# Patient Record
Sex: Female | Born: 1965
Health system: Southern US, Community
[De-identification: ages and names within clinical notes are randomized; demographics above are authoritative.]

## PROBLEM LIST (undated history)

## (undated) DIAGNOSIS — T8859XA Other complications of anesthesia, initial encounter: Secondary | ICD-10-CM

## (undated) DIAGNOSIS — R112 Nausea with vomiting, unspecified: Secondary | ICD-10-CM

## (undated) DIAGNOSIS — I10 Essential (primary) hypertension: Secondary | ICD-10-CM

## (undated) DIAGNOSIS — K219 Gastro-esophageal reflux disease without esophagitis: Secondary | ICD-10-CM

## (undated) DIAGNOSIS — J189 Pneumonia, unspecified organism: Secondary | ICD-10-CM

## (undated) DIAGNOSIS — M1990 Unspecified osteoarthritis, unspecified site: Secondary | ICD-10-CM

## (undated) DIAGNOSIS — F32A Depression, unspecified: Secondary | ICD-10-CM

## (undated) DIAGNOSIS — D649 Anemia, unspecified: Secondary | ICD-10-CM

## (undated) DIAGNOSIS — F419 Anxiety disorder, unspecified: Secondary | ICD-10-CM

## (undated) DIAGNOSIS — F319 Bipolar disorder, unspecified: Secondary | ICD-10-CM

## (undated) DIAGNOSIS — Z9889 Other specified postprocedural states: Secondary | ICD-10-CM

## (undated) DIAGNOSIS — F209 Schizophrenia, unspecified: Secondary | ICD-10-CM

## (undated) HISTORY — PX: JOINT REPLACEMENT: SHX530

## (undated) HISTORY — PX: CHOLECYSTECTOMY: SHX55

## (undated) HISTORY — PX: NOSE SURGERY: SHX723

## (undated) HISTORY — PX: TONSILLECTOMY: SUR1361

---

## 2019-03-26 ENCOUNTER — Other Ambulatory Visit: Payer: Self-pay | Admitting: Podiatry

## 2019-03-26 ENCOUNTER — Ambulatory Visit: Payer: Self-pay | Admitting: Podiatry

## 2019-03-26 DIAGNOSIS — M79672 Pain in left foot: Secondary | ICD-10-CM

## 2019-11-07 ENCOUNTER — Other Ambulatory Visit: Payer: Self-pay | Admitting: Orthopedic Surgery

## 2019-11-13 DIAGNOSIS — R0602 Shortness of breath: Secondary | ICD-10-CM | POA: Diagnosis not present

## 2019-11-29 NOTE — Progress Notes (Addendum)
PCP - [facility] Cardiologist -   PPM/ICD -  Device Orders -  Rep Notified -   Chest x-ray -  EKG [facility]  Stress Test -  ECHO -  Cardiac Cath -   Sleep Study -  CPAP -   Fasting Blood Sugar -  Checks Blood Sugar _____ times a day  Blood Thinner Instructions: Aspirin Instructions:  ERAS Protcol - PRE-SURGERY Ensure or G2-   COVID TEST-  No COVID vaccinations   Anesthesia review:   Patient denies shortness of breath, fever, cough and chest pain at PAT appointment   All instructions explained to the patient, with a verbal understanding of the material. Patient agrees to go over the instructions while at home for a better understanding. Patient also instructed to self quarantine after being tested for COVID-19. The opportunity to ask questions was provided.

## 2019-11-29 NOTE — Patient Instructions (Addendum)
DUE TO COVID-19 ONLY ONE VISITOR IS ALLOWED TO COME WITH YOU AND STAY IN THE WAITING ROOM ONLY DURING PRE OP AND PROCEDURE DAY OF SURGERY. THE 1 VISITOR MAY VISIT WITH YOU AFTER SURGERY IN YOUR PRIVATE ROOM DURING VISITING HOURS ONLY!  YOU NEED TO HAVE A COVID 19 TEST ON 12-05-19 @12 :00 PM, THIS TEST MUST BE DONE BEFORE SURGERY, COME  [address], [place] , .  [facility]) ONCE YOUR COVID TEST IS COMPLETED, PLEASE BEGIN THE QUARANTINE INSTRUCTIONS AS OUTLINED IN YOUR HANDOUT.                Terri Oneill  11/29/2019   Your procedure is scheduled on: 12-09-19   Report to [facility] Main  Entrance    Report to Admitting at  5:30 AM     Call this number if you have problems the morning of surgery [phone]    Remember: NO SOLID FOOD AFTER MIDNIGHT THE NIGHT PRIOR TO SURGERY. NOTHING BY MOUTH EXCEPT CLEAR LIQUIDS UNTIL 4:15 AM . PLEASE FINISH ENSURE DRINK PER SURGEON ORDER  WHICH NEEDS TO BE COMPLETED AT 4:15 AM .   CLEAR LIQUID DIET   Foods Allowed                                                                     Foods Excluded  Coffee and tea, regular and decaf                             liquids that you cannot  Plain Jell-O any favor except red or purple                                           see through such as: Fruit ices (not with fruit pulp)                                     milk, soups, orange juice  Iced Popsicles                                    All solid food Carbonated beverages, regular and diet                                    Cranberry, grape and apple juices Sports drinks like Gatorade Lightly seasoned clear broth or consume(fat free) Sugar, honey syrup  _____________________________________________________________________     Take these medicines the morning of surgery with A SIP OF WATER:  Aripiprazole (Abilify), Atenolol (Tenormin), Clonidine (Catapres), Hydralazine (Apresoline), Omeprazole (Prilosec),  Oxybutynin (Ditropan), and Oxycodone, prn   BRUSH YOUR TEETH MORNING OF SURGERY AND RINSE YOUR MOUTH OUT, NO CHEWING GUM CANDY OR MINTS.  You may not have any metal on your body including hair pins and              piercings     Do not wear jewelry, make-up, lotions, powders or perfumes, deodorant              Do not wear nail polish on your fingernails.  Do not shave  48 hours prior to surgery.                Do not bring valuables to the hospital. [place].  Contacts, dentures or bridgework may not be worn into surgery.  Leave suitcase in the car. After surgery it may be brought to your room.      Special Instructions: N/A              Please read over the following fact sheets you were given: _____________________________________________________________________             [facility] - Preparing for Surgery Before surgery, you can play an important role.  Because skin is not sterile, your skin needs to be as free of germs as possible.  You can reduce the number of germs on your skin by washing with CHG (chlorahexidine gluconate) soap before surgery.  CHG is an antiseptic cleaner which kills germs and bonds with the skin to continue killing germs even after washing. Please DO NOT use if you have an allergy to CHG or antibacterial soaps.  If your skin becomes reddened/irritated stop using the CHG and inform your nurse when you arrive at Short Stay. Do not shave (including legs and underarms) for at least 48 hours prior to the first CHG shower.  You may shave your face/neck. Please follow these instructions carefully:  1.  Shower with CHG Soap the night before surgery and the  morning of Surgery.  2.  If you choose to wash your hair, wash your hair first as usual with your  normal  shampoo.  3.  After you shampoo, rinse your hair and body thoroughly to remove the  shampoo.                           4.   Use CHG as you would any other liquid soap.  You can apply chg directly  to the skin and wash                       Gently with a scrungie or clean washcloth.  5.  Apply the CHG Soap to your body ONLY FROM THE NECK DOWN.   Do not use on face/ open                           Wound or open sores. Avoid contact with eyes, ears mouth and genitals (private parts).                       Wash face,  Genitals (private parts) with your normal soap.             6.  Wash thoroughly, paying special attention to the area where your surgery  will be performed.  7.  Thoroughly rinse your body with warm water from the neck down.  8.  DO NOT shower/wash with  your normal soap after using and rinsing off  the CHG Soap.                9.  Pat yourself dry with a clean towel.            10.  Wear clean pajamas.            11.  Place clean sheets on your bed the night of your first shower and do not  sleep with pets. Day of Surgery : Do not apply any lotions/deodorants the morning of surgery.  Please wear clean clothes to the hospital/surgery center.  FAILURE TO FOLLOW THESE INSTRUCTIONS MAY RESULT IN THE CANCELLATION OF YOUR SURGERY PATIENT SIGNATURE_________________________________  NURSE SIGNATURE__________________________________  ________________________________________________________________________   Rogelia Mire  An incentive spirometer is a tool that can help keep your lungs clear and active. This tool measures how well you are filling your lungs with each breath. Taking long deep breaths may help reverse or decrease the chance of developing breathing (pulmonary) problems (especially infection) following:  A long period of time when you are unable to move or be active. BEFORE THE PROCEDURE   If the spirometer includes an indicator to show your best effort, your nurse or respiratory therapist will set it to a desired goal.  If possible, sit up straight or lean slightly forward. Try not to  slouch.  Hold the incentive spirometer in an upright position. INSTRUCTIONS FOR USE  1. Sit on the edge of your bed if possible, or sit up as far as you can in bed or on a chair. 2. Hold the incentive spirometer in an upright position. 3. Breathe out normally. 4. Place the mouthpiece in your mouth and seal your lips tightly around it. 5. Breathe in slowly and as deeply as possible, raising the piston or the ball toward the top of the column. 6. Hold your breath for 3-5 seconds or for as long as possible. Allow the piston or ball to fall to the bottom of the column. 7. Remove the mouthpiece from your mouth and breathe out normally. 8. Rest for a few seconds and repeat Steps 1 through 7 at least 10 times every 1-2 hours when you are awake. Take your time and take a few normal breaths between deep breaths. 9. The spirometer may include an indicator to show your best effort. Use the indicator as a goal to work toward during each repetition. 10. After each set of 10 deep breaths, practice coughing to be sure your lungs are clear. If you have an incision (the cut made at the time of surgery), support your incision when coughing by placing a pillow or rolled up towels firmly against it. Once you are able to get out of bed, walk around indoors and cough well. You may stop using the incentive spirometer when instructed by your caregiver.  RISKS AND COMPLICATIONS  Take your time so you do not get dizzy or light-headed.  If you are in pain, you may need to take or ask for pain medication before doing incentive spirometry. It is harder to take a deep breath if you are having pain. AFTER USE  Rest and breathe slowly and easily.  It can be helpful to keep track of a log of your progress. Your caregiver can provide you with a simple table to help with this. If you are using the spirometer at home, follow these instructions: SEEK MEDICAL CARE IF:   You are having difficultly using the spirometer.  You  have trouble using the spirometer as often as instructed.  Your pain medication is not giving enough relief while using the spirometer.  You develop fever of 100.5 F (38.1 C) or higher. SEEK IMMEDIATE MEDICAL CARE IF:   You cough up bloody sputum that had not been present before.  You develop fever of 102 F (38.9 C) or greater.  You develop worsening pain at or near the incision site. MAKE SURE YOU:   Understand these instructions.  Will watch your condition.  Will get help right away if you are not doing well or get worse. Document Released: 10/24/2006 Document Revised: 09/05/2011 Document Reviewed: 12/25/2006 Exit[employer] Patient Information 2014 [employer], [place].   ________________________________________________________________________  WHAT IS A BLOOD TRANSFUSION? Blood Transfusion Information  A transfusion is the replacement of blood or some of its parts. Blood is made up of multiple cells which provide different functions.  Red blood cells carry oxygen and are used for blood loss replacement.  White blood cells fight against infection.  Platelets control bleeding.  Plasma helps clot blood.  Other blood products are available for specialized needs, such as hemophilia or other clotting disorders. BEFORE THE TRANSFUSION  Who gives blood for transfusions?   Healthy volunteers who are fully evaluated to make sure their blood is safe. This is blood bank blood. Transfusion therapy is the safest it has ever been in the practice of medicine. Before blood is taken from a donor, a complete history is taken to make sure that person has no history of diseases nor engages in risky social behavior (examples are intravenous drug use or sexual activity with multiple partners). The donor's travel history is screened to minimize risk of transmitting infections, such as malaria. The donated blood is tested for signs of infectious diseases, such as HIV and hepatitis. The blood is then  tested to be sure it is compatible with you in order to minimize the chance of a transfusion reaction. If you or a relative donates blood, this is often done in anticipation of surgery and is not appropriate for emergency situations. It takes many days to process the donated blood. RISKS AND COMPLICATIONS Although transfusion therapy is very safe and saves many lives, the main dangers of transfusion include:   Getting an infectious disease.  Developing a transfusion reaction. This is an allergic reaction to something in the blood you were given. Every precaution is taken to prevent this. The decision to have a blood transfusion has been considered carefully by your caregiver before blood is given. Blood is not given unless the benefits outweigh the risks. AFTER THE TRANSFUSION  Right after receiving a blood transfusion, you will usually feel much better and more energetic. This is especially true if your red blood cells have gotten low (anemic). The transfusion raises the level of the red blood cells which carry oxygen, and this usually causes an energy increase.  The nurse administering the transfusion will monitor you carefully for complications. HOME CARE INSTRUCTIONS  No special instructions are needed after a transfusion. You may find your energy is better. Speak with your caregiver about any limitations on activity for underlying diseases you may have. SEEK MEDICAL CARE IF:   Your condition is not improving after your transfusion.  You develop redness or irritation at the intravenous (IV) site. SEEK IMMEDIATE MEDICAL CARE IF:  Any of the following symptoms occur over the next 12 hours:  Shaking chills.  You have a temperature by mouth above 102 F (38.9 C), not  controlled by medicine.  Chest, back, or muscle pain.  People around you feel you are not acting correctly or are confused.  Shortness of breath or difficulty breathing.  Dizziness and fainting.  You get a rash or  develop hives.  You have a decrease in urine output.  Your urine turns a dark color or changes to pink, red, or brown. Any of the following symptoms occur over the next 10 days:  You have a temperature by mouth above 102 F (38.9 C), not controlled by medicine.  Shortness of breath.  Weakness after normal activity.  The white part of the eye turns yellow (jaundice).  You have a decrease in the amount of urine or are urinating less often.  Your urine turns a dark color or changes to pink, red, or brown. Document Released: 06/10/2000 Document Revised: 09/05/2011 Document Reviewed: 01/28/2008 [facility] Patient Information 2014 [place], [place].  _______________________________________________________________________

## 2019-12-02 ENCOUNTER — Other Ambulatory Visit: Payer: Self-pay

## 2019-12-02 ENCOUNTER — Encounter (HOSPITAL_COMMUNITY): Payer: Self-pay

## 2019-12-02 ENCOUNTER — Encounter (HOSPITAL_COMMUNITY)
Admission: RE | Admit: 2019-12-02 | Discharge: 2019-12-02 | Disposition: A | Discharge status: Home / Self Care | Payer: Medicare Other | Source: Ambulatory Visit | Attending: Orthopedic Surgery | Admitting: Orthopedic Surgery

## 2019-12-02 ENCOUNTER — Ambulatory Visit (HOSPITAL_COMMUNITY)
Admission: RE | Admit: 2019-12-02 | Discharge: 2019-12-02 | Disposition: A | Discharge status: Home / Self Care | Payer: Medicare Other | Source: Ambulatory Visit | Attending: Orthopedic Surgery | Admitting: Orthopedic Surgery

## 2019-12-02 DIAGNOSIS — R9431 Abnormal electrocardiogram [ECG] [EKG]: Secondary | ICD-10-CM | POA: Insufficient documentation

## 2019-12-02 DIAGNOSIS — Z01818 Encounter for other preprocedural examination: Secondary | ICD-10-CM | POA: Insufficient documentation

## 2019-12-02 HISTORY — DX: Unspecified osteoarthritis, unspecified site: M19.90

## 2019-12-02 HISTORY — DX: Anxiety disorder, unspecified: F41.9

## 2019-12-02 HISTORY — DX: Pneumonia, unspecified organism: J18.9

## 2019-12-02 HISTORY — DX: Gastro-esophageal reflux disease without esophagitis: K21.9

## 2019-12-02 HISTORY — DX: Depression, unspecified: F32.A

## 2019-12-02 HISTORY — DX: Other complications of anesthesia, initial encounter: T88.59XA

## 2019-12-02 HISTORY — DX: Bipolar disorder, unspecified: F31.9

## 2019-12-02 HISTORY — DX: Other specified postprocedural states: R11.2

## 2019-12-02 HISTORY — DX: Anemia, unspecified: D64.9

## 2019-12-02 HISTORY — DX: Nausea with vomiting, unspecified: Z98.890

## 2019-12-02 HISTORY — DX: Schizophrenia, unspecified: F20.9

## 2019-12-02 HISTORY — DX: Essential (primary) hypertension: I10

## 2019-12-02 LAB — URINALYSIS, ROUTINE W REFLEX MICROSCOPIC
Bilirubin Urine: NEGATIVE
Glucose, UA: NEGATIVE mg/dL
Hgb urine dipstick: NEGATIVE
Ketones, ur: NEGATIVE mg/dL
Leukocytes,Ua: NEGATIVE
Nitrite: NEGATIVE
Protein, ur: NEGATIVE mg/dL
Specific Gravity, Urine: 1.012 (ref 1.005–1.030)
pH: 5 (ref 5.0–8.0)

## 2019-12-02 LAB — CBC WITH DIFFERENTIAL/PLATELET
Abs Immature Granulocytes: 0.05 10*3/uL (ref 0.00–0.07)
Basophils Absolute: 0 10*3/uL (ref 0.0–0.1)
Basophils Relative: 0 %
Eosinophils Absolute: 0.2 10*3/uL (ref 0.0–0.5)
Eosinophils Relative: 2 %
HCT: 39.7 % (ref 36.0–46.0)
Hemoglobin: 13.3 g/dL (ref 12.0–15.0)
Immature Granulocytes: 1 %
Lymphocytes Relative: 22 %
Lymphs Abs: 2.2 10*3/uL (ref 0.7–4.0)
MCH: 30.8 pg (ref 26.0–34.0)
MCHC: 33.5 g/dL (ref 30.0–36.0)
MCV: 91.9 fL (ref 80.0–100.0)
Monocytes Absolute: 0.7 10*3/uL (ref 0.1–1.0)
Monocytes Relative: 7 %
Neutro Abs: 6.7 10*3/uL (ref 1.7–7.7)
Neutrophils Relative %: 68 %
Platelets: 220 10*3/uL (ref 150–400)
RBC: 4.32 MIL/uL (ref 3.87–5.11)
RDW: 12.8 % (ref 11.5–15.5)
WBC: 9.9 10*3/uL (ref 4.0–10.5)
nRBC: 0 % (ref 0.0–0.2)

## 2019-12-02 LAB — BASIC METABOLIC PANEL
Anion gap: 10 (ref 5–15)
BUN: 14 mg/dL (ref 6–20)
CO2: 25 mmol/L (ref 22–32)
Calcium: 9.1 mg/dL (ref 8.9–10.3)
Chloride: 105 mmol/L (ref 98–111)
Creatinine, Ser: 0.88 mg/dL (ref 0.44–1.00)
GFR calc Af Amer: >60 mL/min (ref >60)
GFR calc non Af Amer: >60 mL/min (ref >60)
Glucose, Bld: 98 mg/dL (ref 70–99)
Potassium: 4.1 mmol/L (ref 3.5–5.1)
Sodium: 140 mmol/L (ref 135–145)

## 2019-12-02 LAB — ABO/RH: ABO/RH(D): A NEG

## 2019-12-02 LAB — PROTIME-INR
INR: 1.1 (ref 0.8–1.2)
Prothrombin Time: 13.3 seconds (ref 11.4–15.2)

## 2019-12-02 LAB — APTT: aPTT: 26 seconds (ref 24–36)

## 2019-12-02 LAB — SURGICAL PCR SCREEN
MRSA, PCR: NEGATIVE
Staphylococcus aureus: POSITIVE — AB

## 2019-12-05 ENCOUNTER — Other Ambulatory Visit (HOSPITAL_COMMUNITY)
Admission: RE | Admit: 2019-12-05 | Discharge: 2019-12-05 | Disposition: A | Discharge status: Home / Self Care | Payer: Medicare Other | Source: Ambulatory Visit | Attending: Orthopedic Surgery | Admitting: Orthopedic Surgery

## 2019-12-05 DIAGNOSIS — Z20822 Contact with and (suspected) exposure to covid-19: Secondary | ICD-10-CM | POA: Diagnosis not present

## 2019-12-05 DIAGNOSIS — Z01812 Encounter for preprocedural laboratory examination: Secondary | ICD-10-CM | POA: Diagnosis present

## 2019-12-05 LAB — SARS CORONAVIRUS 2 (TAT 6-24 HRS): SARS Coronavirus 2: NEGATIVE

## 2019-12-06 DIAGNOSIS — M1611 Unilateral primary osteoarthritis, right hip: Secondary | ICD-10-CM | POA: Diagnosis present

## 2019-12-06 NOTE — H&P (Signed)
TOTAL HIP ADMISSION H&P  Patient is admitted for right total hip arthroplasty.  Subjective:  Chief Complaint: right hip pain  HPI: Terri Oneill, 54 y.o. female, has a history of pain and functional disability in the right hip(s) due to arthritis and patient has failed non-surgical conservative treatments for greater than 12 weeks to include NSAID's and/or analgesics and activity modification.  Onset of symptoms was gradual starting several years ago with gradually worsening course since that time.The patient noted no past surgery on the right hip(s).  Patient currently rates pain in the right hip at 10 out of 10 with activity. Patient has night pain, worsening of pain with activity and weight bearing, trendelenberg gait, pain that interfers with activities of daily living and pain with passive range of motion. Patient has evidence of joint subluxation and joint space narrowing by imaging studies. This condition presents safety issues increasing the risk of falls.    There is no current active infection.  Patient Active Problem List   Diagnosis Date Noted  . Osteoarthritis of right hip 12/06/2019   Past Medical History:  Diagnosis Date  . Anemia   . Anxiety   . Arthritis   . Bipolar disorder (HCC)   . Complication of anesthesia   . Depression   . GERD (gastroesophageal reflux disease)   . Hypertension   . Pneumonia   . PONV (postoperative nausea and vomiting)   . Schizophrenia [facility])     Past Surgical History:  Procedure Laterality Date  . CHOLECYSTECTOMY    . JOINT REPLACEMENT Bilateral    2018 R Knee, 2019 -L knee  . NOSE SURGERY    . TONSILLECTOMY      No current facility-administered medications for this encounter.   Current Outpatient Medications  Medication Sig Dispense Refill Last Dose  . ARIPiprazole (ABILIFY) 2 MG tablet Take 2 mg by mouth daily.     Marland Kitchen atenolol (TENORMIN) 100 MG tablet Take 100 mg by mouth daily.     . cloNIDine (CATAPRES) 0.1 MG tablet Take 0.1 mg  by mouth in the morning, at noon, and at bedtime.     Marland Kitchen estradiol (ESTRACE) 0.5 MG tablet Take 0.5 mg by mouth every evening.     . hydrALAZINE (APRESOLINE) 100 MG tablet Take 100 mg by mouth 3 (three) times daily.     Marland Kitchen lisinopril (ZESTRIL) 5 MG tablet Take 5 mg by mouth daily.     Marland Kitchen lovastatin (MEVACOR) 40 MG tablet Take 40 mg by mouth daily.     . Multiple Vitamin (MULTIVITAMIN WITH MINERALS) TABS tablet Take 1 tablet by mouth daily. Centrum Silver for Women 50+     . naproxen (NAPROSYN) 500 MG tablet Take 500 mg by mouth in the morning and at bedtime.     . naproxen sodium (ALEVE) 220 MG tablet Take 880 mg by mouth 3 (three) times daily as needed (pain.).     Marland Kitchen nortriptyline (PAMELOR) 50 MG capsule Take 50 mg by mouth at bedtime.     Marland Kitchen omeprazole (PRILOSEC) 20 MG capsule Take 20 mg by mouth daily before breakfast.     . oxybutynin (DITROPAN) 5 MG tablet Take 5 mg by mouth daily.     Marland Kitchen oxyCODONE-acetaminophen (PERCOCET) 10-325 MG tablet Take 1 tablet by mouth 4 (four) times daily as needed for pain.     . progesterone (PROMETRIUM) 100 MG capsule Take 100 mg by mouth at bedtime.     . traZODone (DESYREL) 100 MG tablet Take 300  mg by mouth at bedtime.      No Known Allergies  Social History   Tobacco Use  . Smoking status: Never Smoker  . Smokeless tobacco: Never Used  Substance Use Topics  . Alcohol use: Not Currently    Comment: Pt reports that she is in 'recovery'    No family history on file.   Review of Systems  Constitutional: Positive for diaphoresis and fatigue.  HENT: Negative.   Eyes: Negative.   Respiratory: Negative.   Cardiovascular:       HTN  Gastrointestinal: Positive for constipation and nausea.  Genitourinary: Positive for frequency.       Poor bladder control  Musculoskeletal: Positive for arthralgias and myalgias.  Skin: Negative.   Allergic/Immunologic: Negative.   Neurological: Negative.   Hematological: Negative.   Psychiatric/Behavioral: The patient  is nervous/anxious.        Depression    Objective:  Physical Exam  Constitutional: She is oriented to person, place, and time.  HENT:  Head: Normocephalic and atraumatic.  Nose: Nose normal.  Eyes: Pupils are equal, round, and reactive to light.  Cardiovascular: Normal pulses.  Respiratory: Effort normal.  Musculoskeletal:     Cervical back: Normal range of motion and neck supple.     Comments: Patient walks with a profound right-sided limp highly irritable right hip to internal less so external rotation.  Both total knees have well-healed surgical scars come to full extension and flex to 115 collateral ligaments are stable neurovascular intact no swelling or erythema no effusions.  Nontender over the trochanteric bursa.  She does have right-sided hip pain with flexion past 100.  Neurological: She is alert and oriented to person, place, and time.  Skin: Skin is warm and dry.  Psychiatric: Her behavior is normal. Mood, judgment and thought content normal.    Vital signs in last 24 hours:    Labs:   Estimated body mass index is 33.83 kg/m as calculated from the following:   Height as of 12/02/19: 5\' 3"  (1.6 m).   Weight as of 12/02/19: 86.6 kg.   Imaging Review Plain radiographs demonstrate  AP pelvis and crosstable lateral so bone-on-bone arthritic changes to the right hip with pistol-grip deformity peripheral osteophytes and lateral subluxation of the femoral head.     Assessment/Plan:  End stage arthritis, right hip(s)  The patient history, physical examination, clinical judgement of the provider and imaging studies are consistent with end stage degenerative joint disease of the right hip(s) and total hip arthroplasty is deemed medically necessary. The treatment options including medical management, injection therapy, arthroscopy and arthroplasty were discussed at length. The risks and benefits of total hip arthroplasty were presented and reviewed. The risks due to aseptic  loosening, infection, stiffness, dislocation/subluxation,  thromboembolic complications and other imponderables were discussed.  The patient acknowledged the explanation, agreed to proceed with the plan and consent was signed. Patient is being admitted for inpatient treatment for surgery, pain control, PT, OT, prophylactic antibiotics, VTE prophylaxis, progressive ambulation and ADL's and discharge planning.The patient is planning to be discharged home with home health services    Patient's anticipated Oneill is less than 2 midnights, meeting these requirements: - Younger than 56 - Lives within 1 hour of care - Has a competent adult at home to recover with post-op recover - NO history of  - Chronic pain requiring opiods  - Diabetes  - Coronary Artery Disease  - Heart failure  - Heart attack  - Stroke  - DVT/VTE  -  Cardiac arrhythmia  - Respiratory Failure/COPD  - Renal failure  - Anemia  - Advanced Liver disease

## 2019-12-08 MED ORDER — TRANEXAMIC ACID 1000 MG/10ML IV SOLN
2000.0000 mg | INTRAVENOUS | Status: DC
  Filled 2019-12-08: qty 20

## 2019-12-08 MED ORDER — BUPIVACAINE LIPOSOME 1.3 % IJ SUSP
10.0000 mL | INTRAMUSCULAR | Status: DC
  Filled 2019-12-08: qty 10

## 2019-12-09 ENCOUNTER — Ambulatory Visit (HOSPITAL_COMMUNITY): Payer: Medicare Other | Admitting: Certified Registered"

## 2019-12-09 ENCOUNTER — Other Ambulatory Visit: Payer: Self-pay

## 2019-12-09 ENCOUNTER — Observation Stay (HOSPITAL_COMMUNITY)
Admission: RE | Admit: 2019-12-09 | Discharge: 2019-12-11 | Disposition: A | Discharge status: Home / Self Care | Payer: Medicare Other | Source: Ambulatory Visit | Attending: Orthopedic Surgery | Admitting: Orthopedic Surgery

## 2019-12-09 ENCOUNTER — Encounter (HOSPITAL_COMMUNITY): Payer: Self-pay | Admitting: Orthopedic Surgery

## 2019-12-09 ENCOUNTER — Ambulatory Visit (HOSPITAL_COMMUNITY): Payer: Medicare Other

## 2019-12-09 ENCOUNTER — Encounter (HOSPITAL_COMMUNITY)
Admission: RE | Disposition: A | Discharge status: Home / Self Care | Payer: Self-pay | Source: Ambulatory Visit | Attending: Orthopedic Surgery

## 2019-12-09 DIAGNOSIS — M1611 Unilateral primary osteoarthritis, right hip: Secondary | ICD-10-CM | POA: Diagnosis not present

## 2019-12-09 DIAGNOSIS — Z419 Encounter for procedure for purposes other than remedying health state, unspecified: Secondary | ICD-10-CM

## 2019-12-09 DIAGNOSIS — Z96641 Presence of right artificial hip joint: Secondary | ICD-10-CM

## 2019-12-09 DIAGNOSIS — F319 Bipolar disorder, unspecified: Secondary | ICD-10-CM | POA: Diagnosis not present

## 2019-12-09 DIAGNOSIS — Z791 Long term (current) use of non-steroidal anti-inflammatories (NSAID): Secondary | ICD-10-CM | POA: Insufficient documentation

## 2019-12-09 DIAGNOSIS — I10 Essential (primary) hypertension: Secondary | ICD-10-CM | POA: Insufficient documentation

## 2019-12-09 DIAGNOSIS — D62 Acute posthemorrhagic anemia: Secondary | ICD-10-CM | POA: Insufficient documentation

## 2019-12-09 DIAGNOSIS — K219 Gastro-esophageal reflux disease without esophagitis: Secondary | ICD-10-CM | POA: Insufficient documentation

## 2019-12-09 DIAGNOSIS — Z79890 Hormone replacement therapy (postmenopausal): Secondary | ICD-10-CM | POA: Insufficient documentation

## 2019-12-09 DIAGNOSIS — Z96653 Presence of artificial knee joint, bilateral: Secondary | ICD-10-CM | POA: Diagnosis not present

## 2019-12-09 DIAGNOSIS — F209 Schizophrenia, unspecified: Secondary | ICD-10-CM | POA: Diagnosis not present

## 2019-12-09 DIAGNOSIS — F419 Anxiety disorder, unspecified: Secondary | ICD-10-CM | POA: Diagnosis not present

## 2019-12-09 DIAGNOSIS — Z79899 Other long term (current) drug therapy: Secondary | ICD-10-CM | POA: Insufficient documentation

## 2019-12-09 HISTORY — PX: TOTAL HIP ARTHROPLASTY: SHX124

## 2019-12-09 LAB — TYPE AND SCREEN
ABO/RH(D): A NEG
Antibody Screen: NEGATIVE

## 2019-12-09 LAB — HEMOGLOBIN AND HEMATOCRIT, BLOOD
HCT: 32.4 % — ABNORMAL LOW (ref 36.0–46.0)
Hemoglobin: 10.8 g/dL — ABNORMAL LOW (ref 12.0–15.0)

## 2019-12-09 SURGERY — ARTHROPLASTY, HIP, TOTAL, ANTERIOR APPROACH
Anesthesia: Spinal | Site: Hip | Laterality: Right

## 2019-12-09 MED ORDER — FENTANYL CITRATE (PF) 100 MCG/2ML IJ SOLN: 25.0000 ug | INTRAMUSCULAR | Status: DC | PRN

## 2019-12-09 MED ORDER — TRAZODONE HCL 100 MG PO TABS
300.0000 mg | ORAL_TABLET | Freq: Every day | ORAL | Status: DC
  Administered 2019-12-09 – 2019-12-10 (×2): 300 mg via ORAL
  Filled 2019-12-09 (×2): qty 3

## 2019-12-09 MED ORDER — ARIPIPRAZOLE 2 MG PO TABS
2.0000 mg | ORAL_TABLET | Freq: Every day | ORAL | Status: DC
  Administered 2019-12-09 – 2019-12-11 (×3): 2 mg via ORAL
  Filled 2019-12-09 (×3): qty 1

## 2019-12-09 MED ORDER — MENTHOL 3 MG MT LOZG: 1.0000 | LOZENGE | OROMUCOSAL | Status: DC | PRN

## 2019-12-09 MED ORDER — FLEET ENEMA 7-19 GM/118ML RE ENEM: 1.0000 | ENEMA | Freq: Once | RECTAL | Status: DC | PRN

## 2019-12-09 MED ORDER — POLYETHYLENE GLYCOL 3350 17 G PO PACK: 17.0000 g | PACK | Freq: Every day | ORAL | Status: DC | PRN

## 2019-12-09 MED ORDER — MIDAZOLAM HCL 2 MG/2ML IJ SOLN
INTRAMUSCULAR | Status: DC | PRN
  Administered 2019-12-09: 2 mg via INTRAVENOUS

## 2019-12-09 MED ORDER — METOCLOPRAMIDE HCL 5 MG/ML IJ SOLN: 5.0000 mg | Freq: Three times a day (TID) | INTRAMUSCULAR | Status: DC | PRN

## 2019-12-09 MED ORDER — PROGESTERONE MICRONIZED 100 MG PO CAPS
100.0000 mg | ORAL_CAPSULE | Freq: Every day | ORAL | Status: DC
  Administered 2019-12-09 – 2019-12-10 (×2): 100 mg via ORAL
  Filled 2019-12-09 (×3): qty 1

## 2019-12-09 MED ORDER — GABAPENTIN 100 MG PO CAPS
100.0000 mg | ORAL_CAPSULE | Freq: Three times a day (TID) | ORAL | Status: DC
  Administered 2019-12-09 – 2019-12-11 (×7): 100 mg via ORAL
  Filled 2019-12-09 (×7): qty 1

## 2019-12-09 MED ORDER — KCL IN DEXTROSE-NACL 20-5-0.45 MEQ/L-%-% IV SOLN
INTRAVENOUS | Status: DC
  Filled 2019-12-09 (×4): qty 1000

## 2019-12-09 MED ORDER — POVIDONE-IODINE 10 % EX SWAB
2.0000 "application " | Freq: Once | CUTANEOUS | Status: AC
  Administered 2019-12-09: 2 via TOPICAL

## 2019-12-09 MED ORDER — EPHEDRINE 5 MG/ML INJ
INTRAVENOUS | Status: AC
  Filled 2019-12-09: qty 10

## 2019-12-09 MED ORDER — LACTATED RINGERS IV SOLN: INTRAVENOUS | Status: DC

## 2019-12-09 MED ORDER — ONDANSETRON HCL 4 MG/2ML IJ SOLN
INTRAMUSCULAR | Status: AC
  Filled 2019-12-09: qty 2

## 2019-12-09 MED ORDER — PHENYLEPHRINE 40 MCG/ML (10ML) SYRINGE FOR IV PUSH (FOR BLOOD PRESSURE SUPPORT)
PREFILLED_SYRINGE | INTRAVENOUS | Status: DC | PRN
  Administered 2019-12-09: 80 ug via INTRAVENOUS
  Administered 2019-12-09 (×2): 40 ug via INTRAVENOUS
  Administered 2019-12-09: 80 ug via INTRAVENOUS

## 2019-12-09 MED ORDER — STERILE WATER FOR IRRIGATION IR SOLN
Status: DC | PRN
  Administered 2019-12-09: 2000 mL

## 2019-12-09 MED ORDER — SCOPOLAMINE 1 MG/3DAYS TD PT72
MEDICATED_PATCH | TRANSDERMAL | Status: DC | PRN
  Administered 2019-12-09: 1 via TRANSDERMAL

## 2019-12-09 MED ORDER — SUGAMMADEX SODIUM 200 MG/2ML IV SOLN
INTRAVENOUS | Status: DC | PRN
  Administered 2019-12-09: 180 mg via INTRAVENOUS

## 2019-12-09 MED ORDER — BISACODYL 5 MG PO TBEC: 5.0000 mg | DELAYED_RELEASE_TABLET | Freq: Every day | ORAL | Status: DC | PRN

## 2019-12-09 MED ORDER — HYDROMORPHONE HCL 1 MG/ML IJ SOLN
0.2500 mg | INTRAMUSCULAR | Status: DC | PRN
  Administered 2019-12-09 (×2): 0.5 mg via INTRAVENOUS

## 2019-12-09 MED ORDER — LIDOCAINE 2% (20 MG/ML) 5 ML SYRINGE
INTRAMUSCULAR | Status: AC
  Filled 2019-12-09: qty 5

## 2019-12-09 MED ORDER — PHENYLEPHRINE 40 MCG/ML (10ML) SYRINGE FOR IV PUSH (FOR BLOOD PRESSURE SUPPORT)
PREFILLED_SYRINGE | INTRAVENOUS | Status: AC
  Filled 2019-12-09: qty 10

## 2019-12-09 MED ORDER — ORAL CARE MOUTH RINSE: 15.0000 mL | Freq: Once | OROMUCOSAL | Status: AC

## 2019-12-09 MED ORDER — HYDROMORPHONE HCL 1 MG/ML IJ SOLN
INTRAMUSCULAR | Status: AC
  Administered 2019-12-09: 0.5 mg via INTRAVENOUS
  Filled 2019-12-09: qty 1

## 2019-12-09 MED ORDER — FENTANYL CITRATE (PF) 100 MCG/2ML IJ SOLN
INTRAMUSCULAR | Status: AC
  Filled 2019-12-09: qty 2

## 2019-12-09 MED ORDER — PRAVASTATIN SODIUM 20 MG PO TABS
40.0000 mg | ORAL_TABLET | Freq: Every day | ORAL | Status: DC
  Administered 2019-12-09 – 2019-12-10 (×2): 40 mg via ORAL
  Filled 2019-12-09 (×2): qty 2

## 2019-12-09 MED ORDER — ASPIRIN EC 81 MG PO TBEC: 81.0000 mg | DELAYED_RELEASE_TABLET | Freq: Two times a day (BID) | ORAL | 0 refills | Status: AC

## 2019-12-09 MED ORDER — TRANEXAMIC ACID-NACL 1000-0.7 MG/100ML-% IV SOLN
INTRAVENOUS | Status: AC
  Filled 2019-12-09: qty 100

## 2019-12-09 MED ORDER — BUPIVACAINE LIPOSOME 1.3 % IJ SUSP
INTRAMUSCULAR | Status: DC | PRN
  Administered 2019-12-09: 10 mL

## 2019-12-09 MED ORDER — TIZANIDINE HCL 2 MG PO TABS
2.0000 mg | ORAL_TABLET | Freq: Four times a day (QID) | ORAL | 0 refills | Status: AC | PRN
Start: 2019-12-09 — End: ?

## 2019-12-09 MED ORDER — 0.9 % SODIUM CHLORIDE (POUR BTL) OPTIME
TOPICAL | Status: DC | PRN
  Administered 2019-12-09: 1000 mL

## 2019-12-09 MED ORDER — KETOROLAC TROMETHAMINE 30 MG/ML IJ SOLN: 30.0000 mg | Freq: Once | INTRAMUSCULAR | Status: DC | PRN

## 2019-12-09 MED ORDER — ALBUMIN HUMAN 5 % IV SOLN: INTRAVENOUS | Status: DC | PRN

## 2019-12-09 MED ORDER — NORTRIPTYLINE HCL 25 MG PO CAPS
50.0000 mg | ORAL_CAPSULE | Freq: Every day | ORAL | Status: DC
  Administered 2019-12-09 – 2019-12-10 (×2): 50 mg via ORAL
  Filled 2019-12-09 (×2): qty 2

## 2019-12-09 MED ORDER — DEXAMETHASONE SODIUM PHOSPHATE 10 MG/ML IJ SOLN
INTRAMUSCULAR | Status: DC | PRN
  Administered 2019-12-09: 8 mg via INTRAVENOUS

## 2019-12-09 MED ORDER — ROCURONIUM BROMIDE 10 MG/ML (PF) SYRINGE
PREFILLED_SYRINGE | INTRAVENOUS | Status: AC
  Filled 2019-12-09: qty 10

## 2019-12-09 MED ORDER — LIDOCAINE 2% (20 MG/ML) 5 ML SYRINGE
INTRAMUSCULAR | Status: DC | PRN
  Administered 2019-12-09: 40 mg via INTRAVENOUS

## 2019-12-09 MED ORDER — BUPIVACAINE HCL (PF) 0.25 % IJ SOLN
INTRAMUSCULAR | Status: DC | PRN
  Administered 2019-12-09: 30 mL

## 2019-12-09 MED ORDER — ONDANSETRON HCL 4 MG/2ML IJ SOLN: 4.0000 mg | Freq: Four times a day (QID) | INTRAMUSCULAR | Status: DC | PRN

## 2019-12-09 MED ORDER — ROCURONIUM BROMIDE 10 MG/ML (PF) SYRINGE
PREFILLED_SYRINGE | INTRAVENOUS | Status: DC | PRN
  Administered 2019-12-09: 60 mg via INTRAVENOUS
  Administered 2019-12-09: 20 mg via INTRAVENOUS

## 2019-12-09 MED ORDER — ONDANSETRON HCL 4 MG PO TABS: 4.0000 mg | ORAL_TABLET | Freq: Four times a day (QID) | ORAL | Status: DC | PRN

## 2019-12-09 MED ORDER — DEXAMETHASONE SODIUM PHOSPHATE 10 MG/ML IJ SOLN
10.0000 mg | Freq: Once | INTRAMUSCULAR | Status: AC
  Administered 2019-12-10: 10 mg via INTRAVENOUS
  Filled 2019-12-09: qty 1

## 2019-12-09 MED ORDER — ALUM & MAG HYDROXIDE-SIMETH 200-200-20 MG/5ML PO SUSP: 30.0000 mL | ORAL | Status: DC | PRN

## 2019-12-09 MED ORDER — OXYBUTYNIN CHLORIDE 5 MG PO TABS
5.0000 mg | ORAL_TABLET | Freq: Every day | ORAL | Status: DC
  Administered 2019-12-09 – 2019-12-11 (×3): 5 mg via ORAL
  Filled 2019-12-09 (×3): qty 1

## 2019-12-09 MED ORDER — CEFAZOLIN SODIUM-DEXTROSE 2-4 GM/100ML-% IV SOLN
2.0000 g | INTRAVENOUS | Status: AC
  Administered 2019-12-09: 2 g via INTRAVENOUS

## 2019-12-09 MED ORDER — PHENYLEPHRINE HCL (PRESSORS) 10 MG/ML IV SOLN
INTRAVENOUS | Status: AC
  Filled 2019-12-09: qty 1

## 2019-12-09 MED ORDER — SCOPOLAMINE 1 MG/3DAYS TD PT72
MEDICATED_PATCH | TRANSDERMAL | Status: AC
  Filled 2019-12-09: qty 1

## 2019-12-09 MED ORDER — METOCLOPRAMIDE HCL 5 MG PO TABS: 5.0000 mg | ORAL_TABLET | Freq: Three times a day (TID) | ORAL | Status: DC | PRN

## 2019-12-09 MED ORDER — CLONIDINE HCL 0.1 MG PO TABS
0.1000 mg | ORAL_TABLET | Freq: Three times a day (TID) | ORAL | Status: DC
  Administered 2019-12-09 (×2): 0.1 mg via ORAL
  Filled 2019-12-09 (×2): qty 1

## 2019-12-09 MED ORDER — TRANEXAMIC ACID-NACL 1000-0.7 MG/100ML-% IV SOLN
1000.0000 mg | INTRAVENOUS | Status: AC
  Administered 2019-12-09: 1000 mg via INTRAVENOUS

## 2019-12-09 MED ORDER — ESTRADIOL 1 MG PO TABS
0.5000 mg | ORAL_TABLET | Freq: Every evening | ORAL | Status: DC
  Administered 2019-12-09: 0.5 mg via ORAL
  Filled 2019-12-09 (×2): qty 0.5

## 2019-12-09 MED ORDER — ACETAMINOPHEN 500 MG PO TABS
1000.0000 mg | ORAL_TABLET | Freq: Four times a day (QID) | ORAL | Status: AC
  Administered 2019-12-09 – 2019-12-10 (×4): 1000 mg via ORAL
  Filled 2019-12-09 (×4): qty 2

## 2019-12-09 MED ORDER — MEPERIDINE HCL 50 MG/ML IJ SOLN: 6.2500 mg | INTRAMUSCULAR | Status: DC | PRN

## 2019-12-09 MED ORDER — TRANEXAMIC ACID 1000 MG/10ML IV SOLN
INTRAVENOUS | Status: DC | PRN
  Administered 2019-12-09: 2000 mg via TOPICAL

## 2019-12-09 MED ORDER — ONDANSETRON HCL 4 MG/2ML IJ SOLN
INTRAMUSCULAR | Status: DC | PRN
  Administered 2019-12-09: 4 mg via INTRAVENOUS

## 2019-12-09 MED ORDER — BUPIVACAINE HCL 0.25 % IJ SOLN
INTRAMUSCULAR | Status: AC
  Filled 2019-12-09: qty 1

## 2019-12-09 MED ORDER — SODIUM CHLORIDE (PF) 0.9 % IJ SOLN
INTRAMUSCULAR | Status: AC
  Filled 2019-12-09: qty 50

## 2019-12-09 MED ORDER — MIDAZOLAM HCL 2 MG/2ML IJ SOLN
INTRAMUSCULAR | Status: AC
  Filled 2019-12-09: qty 2

## 2019-12-09 MED ORDER — DIPHENHYDRAMINE HCL 12.5 MG/5ML PO ELIX: 12.5000 mg | ORAL_SOLUTION | ORAL | Status: DC | PRN

## 2019-12-09 MED ORDER — LISINOPRIL 5 MG PO TABS
5.0000 mg | ORAL_TABLET | Freq: Every day | ORAL | Status: DC
  Filled 2019-12-09: qty 1

## 2019-12-09 MED ORDER — HYDROMORPHONE HCL 1 MG/ML IJ SOLN
0.5000 mg | INTRAMUSCULAR | Status: DC | PRN
  Administered 2019-12-09: 0.5 mg via INTRAVENOUS
  Filled 2019-12-09: qty 1

## 2019-12-09 MED ORDER — SODIUM CHLORIDE (PF) 0.9 % IJ SOLN
INTRAMUSCULAR | Status: DC | PRN
  Administered 2019-12-09: 50 mL

## 2019-12-09 MED ORDER — CELECOXIB 200 MG PO CAPS
200.0000 mg | ORAL_CAPSULE | Freq: Two times a day (BID) | ORAL | Status: DC
  Administered 2019-12-09 – 2019-12-11 (×5): 200 mg via ORAL
  Filled 2019-12-09 (×5): qty 1

## 2019-12-09 MED ORDER — OXYCODONE HCL 5 MG PO TABS
5.0000 mg | ORAL_TABLET | ORAL | Status: DC | PRN
  Administered 2019-12-09 (×3): 10 mg via ORAL
  Filled 2019-12-09 (×4): qty 2

## 2019-12-09 MED ORDER — FENTANYL CITRATE (PF) 250 MCG/5ML IJ SOLN
INTRAMUSCULAR | Status: DC | PRN
  Administered 2019-12-09 (×2): 50 ug via INTRAVENOUS
  Administered 2019-12-09: 100 ug via INTRAVENOUS
  Administered 2019-12-09 (×2): 25 ug via INTRAVENOUS
  Administered 2019-12-09: 50 ug via INTRAVENOUS

## 2019-12-09 MED ORDER — DOCUSATE SODIUM 100 MG PO CAPS
100.0000 mg | ORAL_CAPSULE | Freq: Two times a day (BID) | ORAL | Status: DC
  Administered 2019-12-09 – 2019-12-11 (×5): 100 mg via ORAL
  Filled 2019-12-09 (×5): qty 1

## 2019-12-09 MED ORDER — PANTOPRAZOLE SODIUM 40 MG PO TBEC
40.0000 mg | DELAYED_RELEASE_TABLET | Freq: Every day | ORAL | Status: DC
  Administered 2019-12-09 – 2019-12-11 (×3): 40 mg via ORAL
  Filled 2019-12-09 (×3): qty 1

## 2019-12-09 MED ORDER — HYDRALAZINE HCL 50 MG PO TABS
100.0000 mg | ORAL_TABLET | Freq: Three times a day (TID) | ORAL | Status: DC
  Administered 2019-12-09: 100 mg via ORAL
  Filled 2019-12-09 (×2): qty 2

## 2019-12-09 MED ORDER — ACETAMINOPHEN 325 MG PO TABS: 325.0000 mg | ORAL_TABLET | Freq: Four times a day (QID) | ORAL | Status: DC | PRN

## 2019-12-09 MED ORDER — PROMETHAZINE HCL 25 MG/ML IJ SOLN: 6.2500 mg | INTRAMUSCULAR | Status: DC | PRN

## 2019-12-09 MED ORDER — CEFAZOLIN SODIUM-DEXTROSE 2-4 GM/100ML-% IV SOLN
INTRAVENOUS | Status: AC
  Filled 2019-12-09: qty 100

## 2019-12-09 MED ORDER — ATENOLOL 100 MG PO TABS
100.0000 mg | ORAL_TABLET | Freq: Once | ORAL | Status: AC
  Administered 2019-12-09: 100 mg via ORAL
  Filled 2019-12-09: qty 1

## 2019-12-09 MED ORDER — ATENOLOL 50 MG PO TABS
100.0000 mg | ORAL_TABLET | Freq: Every day | ORAL | Status: DC
  Administered 2019-12-09: 100 mg via ORAL
  Filled 2019-12-09: qty 2

## 2019-12-09 MED ORDER — PROPOFOL 10 MG/ML IV BOLUS
INTRAVENOUS | Status: AC
  Filled 2019-12-09: qty 40

## 2019-12-09 MED ORDER — CHLORHEXIDINE GLUCONATE 0.12 % MT SOLN
15.0000 mL | Freq: Once | OROMUCOSAL | Status: AC
  Administered 2019-12-09: 15 mL via OROMUCOSAL

## 2019-12-09 MED ORDER — PROPOFOL 10 MG/ML IV BOLUS
INTRAVENOUS | Status: DC | PRN
  Administered 2019-12-09: 160 mg via INTRAVENOUS
  Administered 2019-12-09: 40 mg via INTRAVENOUS

## 2019-12-09 MED ORDER — ASPIRIN 81 MG PO CHEW
81.0000 mg | CHEWABLE_TABLET | Freq: Two times a day (BID) | ORAL | Status: DC
  Administered 2019-12-09 – 2019-12-11 (×4): 81 mg via ORAL
  Filled 2019-12-09 (×4): qty 1

## 2019-12-09 MED ORDER — DEXAMETHASONE SODIUM PHOSPHATE 10 MG/ML IJ SOLN
INTRAMUSCULAR | Status: AC
  Filled 2019-12-09: qty 1

## 2019-12-09 MED ORDER — OXYCODONE-ACETAMINOPHEN 10-325 MG PO TABS: 1.0000 | ORAL_TABLET | ORAL | 0 refills | Status: DC | PRN

## 2019-12-09 MED ORDER — PHENOL 1.4 % MT LIQD: 1.0000 | OROMUCOSAL | Status: DC | PRN

## 2019-12-09 SURGICAL SUPPLY — 48 items
BAG DECANTER FOR FLEXI CONT (MISCELLANEOUS) ×3 IMPLANT
BLADE SAW SGTL 18X1.27X75 (BLADE) ×2 IMPLANT
BLADE SAW SGTL 18X1.27X75MM (BLADE) ×1
BLADE SURG SZ10 CARB STEEL (BLADE) ×6 IMPLANT
CNTNR URN SCR LID CUP LEK RST (MISCELLANEOUS) ×1 IMPLANT
CONT SPEC 4OZ STRL OR WHT (MISCELLANEOUS) ×3
COVER PERINEAL POST (MISCELLANEOUS) ×3 IMPLANT
COVER SURGICAL LIGHT HANDLE (MISCELLANEOUS) ×3 IMPLANT
COVER WAND RF STERILE (DRAPES) ×3 IMPLANT
CUP ACETBLR 48 OD 100 SERIES (Hips) ×2 IMPLANT
DECANTER SPIKE VIAL GLASS SM (MISCELLANEOUS) ×6 IMPLANT
DRAPE STERI IOBAN 125X83 (DRAPES) ×3 IMPLANT
DRAPE U-SHAPE 47X51 STRL (DRAPES) ×6 IMPLANT
DRSG AQUACEL AG ADV 3.5X10 (GAUZE/BANDAGES/DRESSINGS) ×3 IMPLANT
DURAPREP 26ML APPLICATOR (WOUND CARE) ×3 IMPLANT
ELECT BLADE TIP CTD 4 INCH (ELECTRODE) ×3 IMPLANT
ELECT REM PT RETURN 15FT ADLT (MISCELLANEOUS) ×3 IMPLANT
ELIMINATOR HOLE APEX DEPUY (Hips) ×2 IMPLANT
GLOVE BIO SURGEON STRL SZ7.5 (GLOVE) ×3 IMPLANT
GLOVE BIO SURGEON STRL SZ8.5 (GLOVE) ×3 IMPLANT
GLOVE BIOGEL PI IND STRL 8 (GLOVE) ×1 IMPLANT
GLOVE BIOGEL PI IND STRL 9 (GLOVE) ×1 IMPLANT
GLOVE BIOGEL PI INDICATOR 8 (GLOVE) ×2
GLOVE BIOGEL PI INDICATOR 9 (GLOVE) ×2
GOWN STRL REUS W/TWL XL LVL3 (GOWN DISPOSABLE) ×6 IMPLANT
HEAD FEMORAL 32 CERAMIC (Hips) ×2 IMPLANT
HOLDER FOLEY CATH W/STRAP (MISCELLANEOUS) ×3 IMPLANT
KIT TURNOVER KIT A (KITS) IMPLANT
MANIFOLD NEPTUNE II (INSTRUMENTS) ×3 IMPLANT
NDL HYPO 21X1.5 SAFETY (NEEDLE) ×2 IMPLANT
NEEDLE HYPO 21X1.5 SAFETY (NEEDLE) ×6 IMPLANT
NS IRRIG 1000ML POUR BTL (IV SOLUTION) ×3 IMPLANT
PACK ANTERIOR HIP CUSTOM (KITS) ×3 IMPLANT
PENCIL SMOKE EVACUATOR (MISCELLANEOUS) ×2 IMPLANT
PINN ALTRX NEUT ID X OD 32X48 ×2 IMPLANT
STEM FEM ACTIS STD SZ2 (Stem) ×2 IMPLANT
SUT ETHIBOND NAB CT1 #1 30IN (SUTURE) ×3 IMPLANT
SUT VIC AB 0 CT1 27 (SUTURE) ×3
SUT VIC AB 0 CT1 27XBRD ANBCTR (SUTURE) ×1 IMPLANT
SUT VIC AB 1 CTX 36 (SUTURE) ×3
SUT VIC AB 1 CTX36XBRD ANBCTR (SUTURE) ×1 IMPLANT
SUT VIC AB 2-0 CT1 27 (SUTURE)
SUT VIC AB 2-0 CT1 TAPERPNT 27 (SUTURE) IMPLANT
SUT VIC AB 3-0 CT1 27 (SUTURE) ×3
SUT VIC AB 3-0 CT1 TAPERPNT 27 (SUTURE) ×1 IMPLANT
SYR CONTROL 10ML LL (SYRINGE) ×9 IMPLANT
TRAY FOLEY MTR SLVR 14FR STAT (SET/KITS/TRAYS/PACK) ×2 IMPLANT
YANKAUER SUCT BULB TIP 10FT TU (MISCELLANEOUS) ×3 IMPLANT

## 2019-12-09 NOTE — Evaluation (Signed)
Physical Therapy Evaluation Patient Details Name: Terri Oneill MRN: [id number] DOB: 10-29-1965 Today's Date: 12/09/2019   History of Present Illness  s/p R DA THA  Clinical Impression  Pt is s/p TKA resulting in the deficits listed below (see PT Problem List). * Pt amb 90' with RW and min/guard assist. Anticipate steady progress in acute setting. Pt will benefit from skilled PT to increase their independence and safety with mobility to allow discharge to the venue listed below.      Follow Up Recommendations Follow surgeon's recommendation for DC plan and follow-up therapies    Equipment Recommendations  Rolling walker with 5" wheels;3in1 (PT)    Recommendations for Other Services       Precautions / Restrictions Precautions Precautions: Fall Restrictions Weight Bearing Restrictions: No Other Position/Activity Restrictions: WBAT      Mobility  Bed Mobility Overal bed mobility: Needs Assistance Bed Mobility: Supine to Sit     Supine to sit: Min guard;HOB elevated     General bed mobility comments: for safety  Transfers Overall transfer level: Needs assistance Equipment used: Rolling walker (2 wheeled) Transfers: Sit to/from Stand Sit to Stand: Min guard;Min assist         General transfer comment: cues for hand placement  Ambulation/Gait Ambulation/Gait assistance: Min assist Gait Distance (Feet): 90 Feet Assistive device: Rolling walker (2 wheeled) Gait Pattern/deviations: Step-to pattern     General Gait Details: cues for sequence and RW position, good safety awareness, no LOB  Stairs            Wheelchair Mobility    Modified Rankin (Stroke Patients Only)       Balance                                             Pertinent Vitals/Pain Pain Assessment: 0-10 Pain Score: 4  Pain Location: right hip Pain Descriptors / Indicators: Grimacing;Sore Pain Intervention(s): Premedicated before session;Monitored during  session;Limited activity within patient's tolerance    Home Living Family/patient expects to be discharged to:: Private residence Living Arrangements: Spouse/significant other Available Help at Discharge: Family Type of Home: Apartment Home Access: Stairs to enter Entrance Stairs-Rails: Right Entrance Stairs-Number of Steps: 1 and 2 and 1 Home Layout: One level Home Equipment: None      Prior Function Level of Independence: Independent               Hand Dominance        Extremity/Trunk Assessment   Upper Extremity Assessment Upper Extremity Assessment: Overall WFL for tasks assessed    Lower Extremity Assessment Lower Extremity Assessment: RLE deficits/detail RLE Deficits / Details: ankle WFL, knee and hip grossly 2+ to 3/5, limited by post op pain and weakness       Communication   Communication: No difficulties  Cognition Arousal/Alertness: Awake/alert Behavior During Therapy: WFL for tasks assessed/performed Overall Cognitive Status: Within Functional Limits for tasks assessed                                        General Comments      Exercises Total Joint Exercises Ankle Circles/Pumps: AROM;10 reps;Both   Assessment/Plan    PT Assessment Patient needs continued PT services  PT Problem List Decreased strength;Decreased activity tolerance;Decreased mobility;Decreased knowledge of use of  DME       PT Treatment Interventions DME instruction;Therapeutic exercise;Gait training;Therapeutic activities;Functional mobility training;Stair training    PT Goals (Current goals can be found in the Care Plan section)  Acute Rehab PT Goals Patient Stated Goal: home PT Goal Formulation: With patient Time For Goal Achievement: 12/16/19 Potential to Achieve Goals: Good    Frequency 7X/week   Barriers to discharge        Co-evaluation               AM-PAC PT "6 Clicks" Mobility  Outcome Measure Help needed turning from your back  to your side while in a flat bed without using bedrails?: A Little Help needed moving from lying on your back to sitting on the side of a flat bed without using bedrails?: A Little Help needed moving to and from a bed to a chair (including a wheelchair)?: A Little Help needed standing up from a chair using your arms (e.g., wheelchair or bedside chair)?: A Little Help needed to walk in hospital room?: A Little Help needed climbing 3-5 steps with a railing? : A Little 6 Click Score: 18    End of Session Equipment Utilized During Treatment: Gait belt Activity Tolerance: Patient tolerated treatment well Patient left: in chair;with call bell/phone within reach;with chair alarm set Nurse Communication: Mobility status PT Visit Diagnosis: Difficulty in walking, not elsewhere classified (R26.2)    Time: 5003-7048 PT Time Calculation (min) (ACUTE ONLY): 23 min   Charges:   PT Evaluation $PT Eval Low Complexity: 1 Low PT Treatments $Gait Training: 8-22 mins        Delice Bison, PT  Acute Rehab Dept ([facility]) [phone] Pager [phone]  12/09/2019   [facility] 12/09/2019, 3:37 PM

## 2019-12-09 NOTE — Transfer of Care (Signed)
Immediate Anesthesia Transfer of Care Note  Patient: Terri Oneill  Procedure(s) Performed: RIGHT TOTAL HIP ARTHROPLASTY ANTERIOR APPROACH (Right Hip)  Patient Location: PACU  Anesthesia Type:General  Level of Consciousness: awake, drowsy and patient cooperative  Airway & Oxygen Therapy: Patient Spontanous Breathing and Patient connected to face mask oxygen  Post-op Assessment: Report given to RN and Post -op Vital signs reviewed and stable  Post vital signs: Reviewed and stable  Last Vitals:  Vitals Value Taken Time  BP 132/76 12/09/19 0907  Temp    Pulse 88 12/09/19 0909  Resp 22 12/09/19 0909  SpO2 97 % 12/09/19 0909  Vitals shown include unvalidated device data.  Last Pain:  Vitals:   12/09/19 0541  TempSrc: Oral         Complications: No complications documented.

## 2019-12-09 NOTE — Anesthesia Procedure Notes (Signed)
Procedure Name: Intubation Date/Time: 12/09/2019 7:16 AM Performed by: Eben Burow, CRNA Pre-anesthesia Checklist: Patient identified, Emergency Drugs available, Suction available, Patient being monitored and Timeout performed Patient Re-evaluated:Patient Re-evaluated prior to induction Oxygen Delivery Method: Circle system utilized Preoxygenation: Pre-oxygenation with 100% oxygen Induction Type: IV induction Ventilation: Mask ventilation without difficulty Laryngoscope Size: Mac and 4 Grade View: Grade I Tube type: Oral Tube size: 7.0 mm Number of attempts: 1 Airway Equipment and Method: Stylet Placement Confirmation: ETT inserted through vocal cords under direct vision,  positive ETCO2 and breath sounds checked- equal and bilateral Secured at: 21 cm Tube secured with: Tape Dental Injury: Teeth and Oropharynx as per pre-operative assessment

## 2019-12-09 NOTE — Interval H&P Note (Signed)
History and Physical Interval Note:  12/09/2019 6:58 AM  Terri Oneill  has presented today for surgery, with the diagnosis of RIGHT HIP OSTEOARTHRITIS.  The various methods of treatment have been discussed with the patient and family. After consideration of risks, benefits and other options for treatment, the patient has consented to  Procedure(s): RIGHT TOTAL HIP ARTHROPLASTY ANTERIOR APPROACH (Right) as a surgical intervention.  The patient's history has been reviewed, patient examined, no change in status, stable for surgery.  I have reviewed the patient's chart and labs.  Questions were answered to the patient's satisfaction.     Nestor Lewandowsky

## 2019-12-09 NOTE — Op Note (Signed)
PATIENT ID:      Terri Oneill  MRN:     [id number] DOB/AGE:    55-22-1967 / 54 y.o.  OPERATIVE REPORT   DATE OF PROCEDURE:  12/09/2019      PREOPERATIVE DIAGNOSIS:  RIGHT HIP OSTEOARTHRITIS                                                         POSTOPERATIVE DIAGNOSIS:  Same                                                         PROCEDURE: Anterior R total hip arthroplasty using a 48 mm DePuy Pinnacle  Cup, [employer], 0-degree polyethylene liner, a +1 mm x 71mm ceramic head, a 2 std Depuy Actis stem  SURGEON: Nestor Lewandowsky  ASSISTANT:   Tomi Likens. [employer]  (present throughout entire procedure and necessary for timely completion of the procedure)   ANESTHESIA: Spinal, Exparel 133mg  injection BLOOD LOSS: 500 cc FLUID REPLACEMENT: 1700 cc crystalloid TRANEXAMIC ACID: 1gm IV, 2gm Topical COMPLICATIONS: none    INDICATIONS FOR PROCEDURE: A 54 y.o. year-old With  RIGHT HIP OSTEOARTHRITIS   for 3 years, x-rays show bone-on-bone arthritic changes, and osteophytes. Despite conservative measures with observation, anti-inflammatory medicine, narcotics, use of a cane, has severe unremitting pain and can ambulate only a few blocks before resting. Patient desires elective R total hip arthroplasty to decrease pain and increase function. The risks, benefits, and alternatives were discussed at length including but not limited to the risks of infection, bleeding, nerve injury, stiffness, blood clots, the need for revision surgery, cardiopulmonary complications, among others, and they were willing to proceed. Questions answered      PROCEDURE IN DETAIL: The patient was identified by armband,   received preoperative IV antibiotics in the holding area at [facility], taken to the operating room , appropriate anesthetic monitors   were attached and anesthesia was induced with the patient on the gurney. HANA boots were applied to the feet, and the patient  was transferred to the HANA  table with a peroneal post and support underneath the non-operative leg. Theoperative lower extremity was then prepped and draped in the usual sterile fashion from just above the iliac crest to the knee. And a timeout procedure was performed. [facility]. Tomi Likens [facility] was present and scrubbed throughout the case, critical for assistance with, positioning, exposure, retraction, instrumentation, and closure.Skin along incision area was injected with 10 cc of Exparel solution. We then made a 13 cm incision along the interval at the leading edge of the tensor fascia lata of starting at 2 cm lateral to the ASIS. Small bleeders in the skin and subcutaneous tissue identified and cauterized we dissected down to the fascia and made an incision in the fascia allowing [facility] to elevate the fascia of the tensor muscle and exploited the interval between the rectus and the tensor fascia lata. A Cobra retractor was then placed along the superior neck of the femur. A cerebellar retractor was used to expose the interval between the tensor fascia lata and the rectus femoris.  We identified and  cauterized the ascending branch of the anterior circumflex artery. A second Cobra retractor along the inferior neck of the femur. A small Hohmann retractor was placed underneath the origin of the rectus femoris, giving [place] good medial exposure. Using Rongu[employer] fatty tissue was removed from in front of the anterior capsule. The capsule was then incised, starting out at the superior anterior rim of the acetabulum going laterally along the anterior neck. The capsule was then teed along the neck superiorly and inferiorly. Electrocautery was used to release capsule from the anterior and medial neck of the femur to allow external rotation. Cobra retractors were then placed along the inferior and superior neck allowing [place] to perform a standard neck cut and removed the femoral head with a power corkscrew. We then placed a medium bent homan retractor in the  cotyloid notch and posteriorly along the acetabular rim a narrow Cobra retractor. Exposed labral tissue and osteophytes were then removed. We then sequentially reamed up to a 47 mm basket reamer obtaining good coverage in all quadrants, verified by C-arm imaging. Under C-arm control we then hammered into place a 48 mm Pinnacle cup in 45 of abduction and 15 of anteversion. The cup seated nicely and required no supplemental screws. We then placed a central hole Eliminator and a 0 polyethylene liner. The foot was then externally rotated to 130-140. The limb was extended and adducted to the floor, delivering the proximal femur up into the wound. A medium curved Hohmann retractor was placed over the greater trochanter and a long Homan retractor along the posterior femoral neck completing the exposure and lateralizing the femur. We then performed releases superiorly and and inferiorly of the capsule going back to the pirformis fossa superiorly and to the lesser trochanter inferiorly. We then entered the proximal femur with the box cutting offset chisel followed by, a canal sounder, the chili pepper and broaching up to a 2 broach. This seated nicely and we reamed the calcar. A trial reduction was performed with a 1 mm X 32 mm head.The limb lengths were excellent the hip was stable in 90 of external rotation. At this point the trial components removed and we hammered into place a # 2 std  Offset Actis stem with [employer] coating. A + 1 mm x 32 ceramic head was then hammered into place. The hip was reduced and final C-arm images obtained. The wound was thoroughly irrigated with normal saline solution. We repaired the ant capsule and the tensor fascia lot a with running 0 vicryl suture. the subcutaneous tissue was closed with 2-0 and 3-0 Vicryl suture followed by an Aquacil dressing. At this point the patient was awaken and transferred to hospital gurney without difficulty.   Kerin Salen 12/09/2019, 6:59 AM

## 2019-12-09 NOTE — Discharge Instructions (Signed)

## 2019-12-09 NOTE — Anesthesia Preprocedure Evaluation (Addendum)
Anesthesia Evaluation  Patient identified by MRN, date of birth, ID band Patient awake    Reviewed: Allergy & Precautions, NPO status , Patient's Chart, lab work & pertinent test results, reviewed documented beta blocker date and time   History of Anesthesia Complications (+) PONV and history of anesthetic complications  Airway Mallampati: I       Dental no notable dental hx. (+) Teeth Intact   Pulmonary    Pulmonary exam normal breath sounds clear to auscultation       Cardiovascular hypertension, Pt. on medications and Pt. on home beta blockers Normal cardiovascular exam Rhythm:Regular Rate:Normal     Neuro/Psych PSYCHIATRIC DISORDERS Anxiety Depression Bipolar Disorder Schizophrenia negative neurological ROS     GI/Hepatic Neg liver ROS, GERD  Medicated and Controlled,  Endo/Other  negative endocrine ROS  Renal/GU negative Renal ROS  negative genitourinary   Musculoskeletal   Abdominal (+) + obese,   Peds  Hematology   Anesthesia Other Findings   Reproductive/Obstetrics                             Anesthesia Physical Anesthesia Plan  ASA: II  Anesthesia Plan: General   Post-op Pain Management:    Induction: Intravenous  PONV Risk Score and Plan: 4 or greater and Ondansetron, Dexamethasone, Midazolam and Scopolamine patch - Pre-op  Airway Management Planned: Oral ETT  Additional Equipment: None  Intra-op Plan:   Post-operative Plan: Extubation in OR  Informed Consent: I have reviewed the patients History and Physical, chart, labs and discussed the procedure including the risks, benefits and alternatives for the proposed anesthesia with the patient or authorized representative who has indicated his/her understanding and acceptance.     Dental advisory given  Plan Discussed with: CRNA  Anesthesia Plan Comments:        Anesthesia Quick Evaluation

## 2019-12-09 NOTE — Anesthesia Postprocedure Evaluation (Signed)
Anesthesia Post Note  Patient: Terri Oneill  Procedure(s) Performed: RIGHT TOTAL HIP ARTHROPLASTY ANTERIOR APPROACH (Right Hip)     Patient location during evaluation: PACU Anesthesia Type: General Level of consciousness: awake and sedated Pain management: pain level controlled Vital Signs Assessment: post-procedure vital signs reviewed and stable Respiratory status: spontaneous breathing Cardiovascular status: stable Postop Assessment: no apparent nausea or vomiting Anesthetic complications: no   No complications documented.  Last Vitals:  Vitals:   12/09/19 1026 12/09/19 1041  BP: 117/74 125/79  Pulse: 82 82  Resp: 15 16  Temp:    SpO2: 95% 100%    Last Pain:  Vitals:   12/09/19 1026  TempSrc:   PainSc: 5                  John F [address]

## 2019-12-10 ENCOUNTER — Encounter (HOSPITAL_COMMUNITY): Payer: Self-pay | Admitting: Orthopedic Surgery

## 2019-12-10 DIAGNOSIS — M1611 Unilateral primary osteoarthritis, right hip: Secondary | ICD-10-CM | POA: Diagnosis not present

## 2019-12-10 LAB — CBC
HCT: 27.9 % — ABNORMAL LOW (ref 36.0–46.0)
Hemoglobin: 9.1 g/dL — ABNORMAL LOW (ref 12.0–15.0)
MCH: 30 pg (ref 26.0–34.0)
MCHC: 32.6 g/dL (ref 30.0–36.0)
MCV: 92.1 fL (ref 80.0–100.0)
Platelets: 163 10*3/uL (ref 150–400)
RBC: 3.03 MIL/uL — ABNORMAL LOW (ref 3.87–5.11)
RDW: 13.1 % (ref 11.5–15.5)
WBC: 16.9 10*3/uL — ABNORMAL HIGH (ref 4.0–10.5)
nRBC: 0 % (ref 0.0–0.2)

## 2019-12-10 LAB — BASIC METABOLIC PANEL
Anion gap: 9 (ref 5–15)
BUN: 15 mg/dL (ref 6–20)
CO2: 23 mmol/L (ref 22–32)
Calcium: 8.5 mg/dL — ABNORMAL LOW (ref 8.9–10.3)
Chloride: 105 mmol/L (ref 98–111)
Creatinine, Ser: 1.02 mg/dL — ABNORMAL HIGH (ref 0.44–1.00)
GFR calc Af Amer: >60 mL/min (ref >60)
GFR calc non Af Amer: >60 mL/min (ref >60)
Glucose, Bld: 260 mg/dL — ABNORMAL HIGH (ref 70–99)
Potassium: 3.9 mmol/L (ref 3.5–5.1)
Sodium: 137 mmol/L (ref 135–145)

## 2019-12-10 MED ORDER — SODIUM CHLORIDE 0.9 % IV BOLUS
500.0000 mL | Freq: Once | INTRAVENOUS | Status: AC
  Administered 2019-12-10: 500 mL via INTRAVENOUS

## 2019-12-10 MED ORDER — CHLORHEXIDINE GLUCONATE CLOTH 2 % EX PADS: 6.0000 | MEDICATED_PAD | Freq: Every day | CUTANEOUS | Status: DC

## 2019-12-10 MED ORDER — OXYCODONE HCL 5 MG PO TABS
5.0000 mg | ORAL_TABLET | ORAL | Status: DC | PRN
  Administered 2019-12-10 – 2019-12-11 (×4): 5 mg via ORAL
  Filled 2019-12-10 (×4): qty 1

## 2019-12-10 NOTE — Progress Notes (Signed)
Physical Therapy Treatment Patient Details Name: Terri Oneill MRN: [id number] DOB: Dec 31, 1965 Today's Date: 12/10/2019    History of Present Illness s/p R DA THA    PT Comments    Pt limited by soft BP, getting bolus, will see again later this pm.   Follow Up Recommendations  Follow surgeon's recommendation for DC plan and follow-up therapies     Equipment Recommendations  Rolling walker with 5" wheels;3in1 (PT)    Recommendations for Other Services       Precautions / Restrictions Precautions Precautions: Fall Restrictions Other Position/Activity Restrictions: WBAT    Mobility  Bed Mobility Overal bed mobility: Needs Assistance Bed Mobility: Supine to Sit     Supine to sit: Min guard;HOB elevated     General bed mobility comments: for safety  Transfers Overall transfer level: Needs assistance Equipment used: Rolling walker (2 wheeled) Transfers: Sit to/from [employer] Sit to Stand: Min guard;Min assist Stand pivot transfers: Min assist       General transfer comment: cues for hand placement  Ambulation/Gait             General Gait Details: deferred amb until pt has bolus, BP  very soft, mild dizzness   Stairs             Wheelchair Mobility    Modified Rankin (Stroke Patients Only)       Balance                                            Cognition Arousal/Alertness: Awake/alert Behavior During Therapy: WFL for tasks assessed/performed Overall Cognitive Status: Within Functional Limits for tasks assessed                                 General Comments: mild dizziness BP very soft, 78/54 manual per NT      Exercises Total Joint Exercises Ankle Circles/Pumps: AROM;10 reps;Both Quad Sets: AROM;Both;10 reps Short Arc Quad: AROM;Right;10 reps Heel Slides: AAROM;Right;10 reps Hip ABduction/ADduction: AROM;Right;10 reps    General Comments        Pertinent Vitals/Pain  Pain Assessment: 0-10 Pain Score: 2  Pain Location: right hip Pain Descriptors / Indicators: Sore Pain Intervention(s): Limited activity within patient's tolerance;Monitored during session;Premedicated before session;Repositioned    Home Living                      Prior Function            PT Goals (current goals can now be found in the care plan section) Acute Rehab PT Goals Patient Stated Goal: home PT Goal Formulation: With patient Time For Goal Achievement: 12/16/19 Potential to Achieve Goals: Good Progress towards PT goals: Progressing toward goals;Not progressing toward goals - comment (medical issues)    Frequency    7X/week      PT Plan Current plan remains appropriate    Co-evaluation              AM-PAC PT "6 Clicks" Mobility   Outcome Measure  Help needed turning from your back to your side while in a flat bed without using bedrails?: A Little Help needed moving from lying on your back to sitting on the side of a flat bed without using bedrails?: A Little Help needed moving to and from a  bed to a chair (including a wheelchair)?: A Little Help needed standing up from a chair using your arms (e.g., wheelchair or bedside chair)?: A Little Help needed to walk in hospital room?: A Little Help needed climbing 3-5 steps with a railing? : A Little 6 Click Score: 18    End of Session Equipment Utilized During Treatment: Gait belt Activity Tolerance: Patient tolerated treatment well Patient left: in chair;with call bell/phone within reach;with chair alarm set Nurse Communication: Mobility status PT Visit Diagnosis: Difficulty in walking, not elsewhere classified (R26.2)     Time: 4782-9562 PT Time Calculation (min) (ACUTE ONLY): 19 min  Charges:  $Therapeutic Exercise: 8-22 mins                     Baxter Flattery, PT  Acute Rehab Dept ([facility]) [phone] Pager [phone]  12/10/2019    [facility] 12/10/2019, 11:08 AM

## 2019-12-10 NOTE — Progress Notes (Signed)
Notified on-call provider Dr. Janee Morn about pts low BP and her complaints of feeling tired, weak and slightly lightheaded. New orders to hold all BP medications this morning. New orders implemented and will continue to monitor pts blood pressure.

## 2019-12-10 NOTE — Progress Notes (Signed)
Physical Therapy Treatment Patient Details Name: Terri Oneill MRN: [id number] DOB: 27-Apr-1966 Today's Date: 12/10/2019    History of Present Illness s/p R DA THA    PT Comments    Pt progressing well this pm. Has had issues with BP today but has had fluid bolus as well as  drinking incr fluids. Pt amb hallway distance, able to go up/down 4 stairs with min/guard. No dizziness reported during PT session. D/c will be up to pt and RN (still has foley,BP soft). Pt is mobilizing well enough to d/c from PT standpoint. If pt stays, will see again in am for further gait/exercises to maximize independence.   Follow Up Recommendations  Follow surgeon's recommendation for DC plan and follow-up therapies     Equipment Recommendations  Rolling walker with 5" wheels;3in1 (PT)    Recommendations for Other Services       Precautions / Restrictions Precautions Precautions: Fall Restrictions Other Position/Activity Restrictions: WBAT    Mobility  Bed Mobility Overal bed mobility: Needs Assistance Bed Mobility: Supine to Sit     Supine to sit: Min guard;HOB elevated     General bed mobility comments: for safety  Transfers Overall transfer level: Needs assistance Equipment used: Rolling walker (2 wheeled) Transfers: Sit to/from [employer] Sit to Stand: Min guard;Supervision         General transfer comment: cues for hand placement  Ambulation/Gait Ambulation/Gait assistance: Min guard;Supervision Gait Distance (Feet): 180 Feet Assistive device: Rolling walker (2 wheeled) Gait Pattern/deviations: Step-to pattern;Step-through pattern     General Gait Details: cues for gait progression, no dizziness during amb    Stairs Stairs: Yes Stairs assistance: Supervision;Min guard Stair Management: One rail Right;One rail Left;Step to pattern Number of Stairs: 4 General stair comments: cues for sequence and safety    Wheelchair Mobility    Modified Rankin  (Stroke Patients Only)       Balance                                            Cognition Arousal/Alertness: Awake/alert Behavior During Therapy: WFL for tasks assessed/performed Overall Cognitive Status: Within Functional Limits for tasks assessed                                 General Comments: mild dizziness BP very soft, 78/54 manual per NT      Exercises Total Joint Exercises Heel Slides:  (reviewed verbally again as well as use of gait belt to assis)    General Comments        Pertinent Vitals/Pain Pain Assessment: 0-10 Pain Score: 5  Pain Location: right hip Pain Descriptors / Indicators: Sore Pain Intervention(s): Limited activity within patient's tolerance;Monitored during session    Home Living                      Prior Function            PT Goals (current goals can now be found in the care plan section) Acute Rehab PT Goals Patient Stated Goal: home PT Goal Formulation: With patient Time For Goal Achievement: 12/16/19 Potential to Achieve Goals: Good Progress towards PT goals: Progressing toward goals    Frequency    7X/week      PT Plan Current plan remains appropriate  Co-evaluation              AM-PAC PT "6 Clicks" Mobility   Outcome Measure  Help needed turning from your back to your side while in a flat bed without using bedrails?: A Little Help needed moving from lying on your back to sitting on the side of a flat bed without using bedrails?: A Little Help needed moving to and from a bed to a chair (including a wheelchair)?: A Little Help needed standing up from a chair using your arms (e.g., wheelchair or bedside chair)?: A Little Help needed to walk in hospital room?: A Little Help needed climbing 3-5 steps with a railing? : A Little 6 Click Score: 18    End of Session Equipment Utilized During Treatment: Gait belt Activity Tolerance: Patient tolerated treatment well Patient  left: in bed;with bed alarm set;with call bell/phone within reach Nurse Communication: Mobility status PT Visit Diagnosis: Difficulty in walking, not elsewhere classified (R26.2)     Time: 4627-0350 PT Time Calculation (min) (ACUTE ONLY): 17 min  Charges:  $Gait Training: 8-22 mins                     Baxter Flattery, PT  Acute Rehab Dept ([place]) [phone] Pager [phone]  12/10/2019    [facility] 12/10/2019, 4:36 PM

## 2019-12-10 NOTE — TOC Transition Note (Addendum)
Transition of Care [facility]) - CM/SW Discharge Note   Patient Details  Name: Terri Oneill MRN: [id number] Date of Birth: 21-Oct-1965  [facility]) CM/SW Contact:  Amada Jupiter, LCSW Phone Number: 12/10/2019, 9:01 AM   Clinical Narrative:   Pt for d/c today. Confirmed DME and plan for HEP.  No further TOC needs.    Final next level of care: Home w Home Health Services Barriers to Discharge: No Barriers Identified   Patient Goals and CMS Choice Patient states their goals for this hospitalization and ongoing recovery are:: go home today      Discharge Placement                       Discharge Plan and Services                DME Arranged: Walker rolling DME Agency: [employer]     Representative spoke with at DME Agency: confirmed with [facility] HH Arranged: PT HH Agency: [facility]       Social Determinants of Health (SDOH) Interventions     Readmission Risk Interventions No flowsheet data found.

## 2019-12-10 NOTE — Progress Notes (Addendum)
PATIENT ID: Terri Oneill  MRN: [id number]  DOB/AGE:  03/08/1966 / 54 y.o.  1 Day Post-Op Procedure(s) (LRB): RIGHT TOTAL HIP ARTHROPLASTY ANTERIOR APPROACH (Right)    PROGRESS NOTE Subjective: Patient is alert, oriented, no Nausea, no Vomiting, yes passing gas, . Taking PO well. Denies SOB, Chest or Calf Pain. Using Incentive Spirometer, PAS in place. Ambulate 2' Patient reports pain as  2/10, patient reports lowBlood pressure may be secondary to taking prescribed dose of pain medicine, ie. 10 mg oxycodone the every 6 hours when at home she is taking a lower dose.  Objective: Vital signs in last 24 hours: Vitals:   12/10/19 0125 12/10/19 0142 12/10/19 0537 12/10/19 0538  BP: 97/64 108/62 (Abnormal) 87/59 98/60  Pulse: 84 84 72 73  Resp: 17 16 18 18   Temp: 98.6 F (37 C)  98.2 F (36.8 C) 98.4 F (36.9 C)  TempSrc: Oral  Oral Oral  SpO2: 98% 97% 96% 99%  Weight:      Height:          Intake/Output from previous day: I/O last 3 completed shifts: In: 5138.3 [P.O.:800; I.V.:3638.3; IV Piggyback:700] Out: 3750 [Urine:3100; Blood:650]   Intake/Output this shift: No intake/output data recorded.   LABORATORY DATA: Recent Labs    12/09/19 1409 12/10/19 0324  WBC  --  16.9*  HGB 10.8* 9.1*  HCT 32.4* 27.9*  PLT  --  163  NA  --  137  K  --  3.9  CL  --  105  CO2  --  23  BUN  --  15  CREATININE  --  1.02*  GLUCOSE  --  260*  CALCIUM  --  8.5*    Examination: Neurologically intact ABD soft Neurovascular intact Sensation intact distally Intact pulses distally Dorsiflexion/Plantar flexion intact Incision: dressing C/D/I No cellulitis present Compartment soft} XR AP&Lat of hip shows well placed\fixed THA  Assessment:   1 Day Post-Op Procedure(s) (LRB): RIGHT TOTAL HIP ARTHROPLASTY ANTERIOR APPROACH (Right) ADDITIONAL DIAGNOSIS:  Expected Acute Blood Loss Anemia, Chronic pain management Anticipated LOS equal to or greater than 2 midnights due to - Age 32  and older with one or more of the following:  - Obesity  - Expected need for hospital services (PT, OT, Nursing) required for safe  discharge  - Anticipated need for postoperative skilled nursing care or inpatient rehab  - Active co-morbidities: Chronic pain requiring opiods   2. Patients relatively low blood pressure may be related to the use of a higher dose of oxycodone after surgery than what she is using at home preoperatively.     Plan: Will decrease scheduled narcotics by 50% to 5 mg oxycodone every 6 hours. Discontinue Dilaudid.  Patient will push by mouth fluids to help with blood pressure also.  PT/OT WBAT, THA  DVT Prophylaxis: SCDx72 hrs, ASA 81 mg BID x 2 weeks  DISCHARGE PLAN: Home, When patient passes physical therapy  DISCHARGE NEEDS: HHPT, Walker and 3-in-1 comode seatPatient ID: [id number], female   DOB: 1965/09/14, 54 y.o.   MRN: [id number]

## 2019-12-11 DIAGNOSIS — M1611 Unilateral primary osteoarthritis, right hip: Secondary | ICD-10-CM | POA: Diagnosis not present

## 2019-12-11 LAB — CBC
HCT: 26.3 % — ABNORMAL LOW (ref 36.0–46.0)
Hemoglobin: 8.6 g/dL — ABNORMAL LOW (ref 12.0–15.0)
MCH: 30.3 pg (ref 26.0–34.0)
MCHC: 32.7 g/dL (ref 30.0–36.0)
MCV: 92.6 fL (ref 80.0–100.0)
Platelets: 148 10*3/uL — ABNORMAL LOW (ref 150–400)
RBC: 2.84 MIL/uL — ABNORMAL LOW (ref 3.87–5.11)
RDW: 13 % (ref 11.5–15.5)
WBC: 14.6 10*3/uL — ABNORMAL HIGH (ref 4.0–10.5)
nRBC: 0 % (ref 0.0–0.2)

## 2019-12-11 NOTE — Plan of Care (Signed)
Pt ready for DC home 

## 2019-12-11 NOTE — Discharge Summary (Signed)
Patient ID: Terri Oneill MRN: [id number] DOB/AGE: [birth year] 54 y.o.  Admit date: 12/09/2019 Discharge date: 12/11/2019  Admission Diagnoses:  Principal Problem:   Osteoarthritis of right hip Active Problems:   H/O total hip arthroplasty, right   Discharge Diagnoses:  Same  Past Medical History:  Diagnosis Date  . Anemia   . Anxiety   . Arthritis   . Bipolar disorder (HCC)   . Complication of anesthesia   . Depression   . GERD (gastroesophageal reflux disease)   . Hypertension   . Pneumonia   . PONV (postoperative nausea and vomiting)   . Schizophrenia (HCC)     Surgeries: Procedure(s): RIGHT TOTAL HIP ARTHROPLASTY ANTERIOR APPROACH on 12/09/2019   Consultants:   Discharged Condition: Improved  Hospital Course: Terri Oneill is an 54 y.o. female who was admitted 12/09/2019 for operative treatment ofOsteoarthritis of right hip. Patient has severe unremitting pain that affects sleep, daily activities, and work/hobbies. After pre-op clearance the patient was taken to the operating room on 12/09/2019 and underwent  Procedure(s): RIGHT TOTAL HIP ARTHROPLASTY ANTERIOR APPROACH.    Patient was given perioperative antibiotics:  Anti-infectives (From admission, onward)   Start     Dose/Rate Route Frequency Ordered Stop   12/09/19 0600  ceFAZolin (ANCEF) IVPB 2g/100 mL premix        2 g 200 mL/hr over 30 Minutes Intravenous On call to O.R. 12/09/19 6237 12/09/19 0716   12/09/19 0526  ceFAZolin (ANCEF) 2-4 GM/100ML-% IVPB       Note to Pharmacy: Viviano Simas   : cabinet override      12/09/19 0526 12/09/19 0723       Patient was given sequential compression devices, early ambulation, and chemoprophylaxis to prevent DVT.  Patient benefited maximally from hospital stay and there were no complications.    Recent vital signs:  Patient Vitals for the past 24 hrs:  BP Temp Temp src Pulse Resp SpO2  12/11/19 0600 (!) 113/58 98.2 F (36.8 C) Oral 88 18 96 %  12/11/19  0135 (!) 104/56 -- -- 91 -- --  12/11/19 0133 121/67 -- -- 94 18 98 %  12/10/19 2258 95/64 98.6 F (37 C) Oral 85 18 98 %  12/10/19 2147 123/70 98.6 F (37 C) Oral 89 18 95 %  12/10/19 1503 103/66 98.2 F (36.8 C) -- 72 16 92 %  12/10/19 1238 (!) 87/59 97.9 F (36.6 C) -- 77 17 94 %  12/10/19 1046 (!) 78/56 -- -- -- -- --  12/10/19 1025 (!) 75/54 -- -- 76 -- --     Recent laboratory studies:  Recent Labs    12/10/19 0324 12/11/19 0320  WBC 16.9* 14.6*  HGB 9.1* 8.6*  HCT 27.9* 26.3*  PLT 163 148*  NA 137  --   K 3.9  --   CL 105  --   CO2 23  --   BUN 15  --   CREATININE 1.02*  --   GLUCOSE 260*  --   CALCIUM 8.5*  --      Discharge Medications:   Allergies as of 12/11/2019   No Known Allergies     Medication List    STOP taking these medications   naproxen 500 MG tablet Commonly known as: NAPROSYN   naproxen sodium 220 MG tablet Commonly known as: ALEVE   oxyCODONE-acetaminophen 10-325 MG tablet Commonly known as: PERCOCET     TAKE these medications   ARIPiprazole 2 MG tablet Commonly known as: ABILIFY Take 2  mg by mouth daily.   aspirin EC 81 MG tablet Take 1 tablet (81 mg total) by mouth 2 (two) times daily.   atenolol 100 MG tablet Commonly known as: TENORMIN Take 100 mg by mouth daily.   cloNIDine 0.1 MG tablet Commonly known as: CATAPRES Take 0.1 mg by mouth in the morning, at noon, and at bedtime.   estradiol 0.5 MG tablet Commonly known as: ESTRACE Take 0.5 mg by mouth every evening.   hydrALAZINE 100 MG tablet Commonly known as: APRESOLINE Take 100 mg by mouth 3 (three) times daily.   lisinopril 5 MG tablet Commonly known as: ZESTRIL Take 5 mg by mouth daily.   lovastatin 40 MG tablet Commonly known as: MEVACOR Take 40 mg by mouth daily.   multivitamin with minerals Tabs tablet Take 1 tablet by mouth daily. Centrum Silver for Women 50+   nortriptyline 50 MG capsule Commonly known as: PAMELOR Take 50 mg by mouth at  bedtime.   omeprazole 20 MG capsule Commonly known as: PRILOSEC Take 20 mg by mouth daily before breakfast.   oxybutynin 5 MG tablet Commonly known as: DITROPAN Take 5 mg by mouth daily.   progesterone 100 MG capsule Commonly known as: PROMETRIUM Take 100 mg by mouth at bedtime.   tiZANidine 2 MG tablet Commonly known as: ZANAFLEX Take 1 tablet (2 mg total) by mouth every 6 (six) hours as needed.   traZODone 100 MG tablet Commonly known as: DESYREL Take 300 mg by mouth at bedtime.            Durable Medical Equipment  (From admission, onward)         Start     Ordered   12/09/19 1045  DME Walker rolling  Once       Question:  Patient needs a walker to treat with the following condition  Answer:  Status post right hip replacement   12/09/19 1044   12/09/19 1045  DME 3 n 1  Once        12/09/19 1044           Discharge Care Instructions  (From admission, onward)         Start     Ordered   12/11/19 0000  Weight bearing as tolerated        12/11/19 0948          Diagnostic Studies: DG Chest 2 View  Result Date: 12/03/2019 CLINICAL DATA:  Preoperative evaluation, history GERD, hypertension EXAM: CHEST - 2 VIEW COMPARISON:  03/30/2017 FINDINGS: Normal heart size, mediastinal contours, and pulmonary vascularity. Lungs clear. No pulmonary infiltrate, pleural effusion or pneumothorax. Osseous structures unremarkable. IMPRESSION: No acute abnormalities. Electronically Signed   By: Lavonia Dana M.D.   On: 12/03/2019 10:00   DG C-Arm 1-60 Min-No Report  Result Date: 12/09/2019 Fluoroscopy was utilized by the requesting physician.  No radiographic interpretation.   DG HIP OPERATIVE UNILAT W OR W/O PELVIS RIGHT  Result Date: 12/09/2019 CLINICAL DATA:  Right anterior hip arthroplasty EXAM: OPERATIVE RIGHT HIP (WITH PELVIS IF PERFORMED) 2 VIEWS TECHNIQUE: Fluoroscopic spot image(s) were submitted for interpretation post-operatively. COMPARISON:  None. FINDINGS:  Fluoroscopy time 0 minutes 11 seconds. 2 spot fluoroscopic nondiagnostic intraoperative right hip radiographs demonstrate postsurgical changes from right total hip arthroplasty with no evidence of right hip dislocation on these frontal views. IMPRESSION: Intraoperative fluoroscopic guidance for right total hip arthroplasty. Electronically Signed   By: Ilona Sorrel M.D.   On: 12/09/2019 08:52  Disposition: Discharge disposition: 01-Home or Self Care       Discharge Instructions    Call MD / Call 911   Complete by: As directed    If you experience chest pain or shortness of breath, CALL 911 and be transported to the hospital emergency room.  If you develope a fever above 101 F, pus (white drainage) or increased drainage or redness at the wound, or calf pain, call your surgeon's office.   Constipation Prevention   Complete by: As directed    Drink plenty of fluids.  Prune juice may be helpful.  You may use a stool softener, such as Colace (over the counter) 100 mg twice a day.  Use MiraLax (over the counter) for constipation as needed.   Diet - low sodium heart healthy   Complete by: As directed    Driving restrictions   Complete by: As directed    No driving for 2 weeks   Increase activity slowly as tolerated   Complete by: As directed    Patient may shower   Complete by: As directed    You may shower without a dressing once there is no drainage.  Do not wash over the wound.  If drainage remains, cover wound with plastic wrap and then shower.   Weight bearing as tolerated   Complete by: As directed        Follow-up Information    Gean Birchwood, MD In 2 weeks.   Specialty: Orthopedic Surgery Contact information: [address] [place] [address] [phone]        [facility] Follow up.   Specialty: Home Health Services Why: to provide home health physical therapy Contact information: [address] [place] [address] [phone]                 Signed: Dannielle Burn 12/11/2019, 9:48 AM

## 2019-12-11 NOTE — Progress Notes (Signed)
PATIENT ID: Terri Oneill  MRN: [id number]  DOB/AGE:  Jan 17, 1966 / 54 y.o.  2 Days Post-Op Procedure(s) (LRB): RIGHT TOTAL HIP ARTHROPLASTY ANTERIOR APPROACH (Right)    PROGRESS NOTE Subjective: Patient is alert, oriented, no Nausea, no Vomiting, yes passing gas, . Taking PO well. Denies SOB, Chest or Calf Pain. Using Incentive Spirometer, PAS in place. Ambulate WBAT with pt practicing steps with therapy Patient reports pain as  5/10  .    Objective: Vital signs in last 24 hours: Vitals:   12/10/19 2258 12/11/19 0133 12/11/19 0135 12/11/19 0600  BP: 95/64 121/67 (!) 104/56 (!) 113/58  Pulse: 85 94 91 88  Resp: 18 18  18   Temp: 98.6 F (37 C)   98.2 F (36.8 C)  TempSrc: Oral   Oral  SpO2: 98% 98%  96%  Weight:      Height:          Intake/Output from previous day: I/O last 3 completed shifts: In: 5263.3 [P.O.:1210; I.V.:4053.3] Out: 4250 [Urine:4250]   Intake/Output this shift: No intake/output data recorded.   LABORATORY DATA: Recent Labs    12/10/19 0324 12/11/19 0320  WBC 16.9* 14.6*  HGB 9.1* 8.6*  HCT 27.9* 26.3*  PLT 163 148*  NA 137  --   K 3.9  --   CL 105  --   CO2 23  --   BUN 15  --   CREATININE 1.02*  --   GLUCOSE 260*  --   CALCIUM 8.5*  --     Examination: Neurologically intact Neurovascular intact Sensation intact distally Intact pulses distally Dorsiflexion/Plantar flexion intact Incision: dressing C/D/I and no drainage No cellulitis present Compartment soft} XR AP&Lat of hip shows well placed\fixed THA  Assessment:   2 Days Post-Op Procedure(s) (LRB): RIGHT TOTAL HIP ARTHROPLASTY ANTERIOR APPROACH (Right) ADDITIONAL DIAGNOSIS:  Expected Acute Blood Loss Anemia, Acute Blood Loss Anemia and Chronic pain management Anticipated LOS equal to or greater than 2 midnights due to - Age 29 and older with one or more of the following:  - Obesity  - Expected need for hospital services (PT, OT, Nursing) required for safe  discharge  -  Anticipated need for postoperative skilled nursing care or inpatient rehab  - Active co-morbidities: Chronic pain requiring opiods OR   - Unanticipated findings during/Post Surgery: Slow post-op progression: GI, pain control, mobility      Plan: PT/OT WBAT, THA  DVT Prophylaxis: SCDx72 hrs, ASA 81 mg BID x 2 weeks  DISCHARGE PLAN: Home  DISCHARGE NEEDS: HHPT, Walker and 3-in-1 comode seat

## 2019-12-11 NOTE — Progress Notes (Signed)
Physical Therapy Treatment Patient Details Name: Terri Oneill MRN: [id number] DOB: 03/04/1966 Today's Date: 12/11/2019    History of Present Illness s/p R DA THA    PT Comments    Pt progressing well today. incr gait distance, improving fluidity of gait. Feels ready for d/c today   Follow Up Recommendations  Follow surgeon's recommendation for DC plan and follow-up therapies     Equipment Recommendations  Rolling walker with 5" wheels;3in1 (PT)    Recommendations for Other Services       Precautions / Restrictions Precautions Precautions: Fall Restrictions Weight Bearing Restrictions: No Other Position/Activity Restrictions: WBAT    Mobility  Bed Mobility Overal bed mobility: Modified Independent             General bed mobility comments: incr time   Transfers Overall transfer level: Needs assistance Equipment used: Rolling walker (2 wheeled) Transfers: Sit to/from Stand Sit to Stand: Supervision         General transfer comment: cues for hand placement  Ambulation/Gait Ambulation/Gait assistance: Supervision;Modified independent (Device/Increase time) Gait Distance (Feet): 300 Feet Assistive device: Rolling walker (2 wheeled) Gait Pattern/deviations: Step-through pattern;Decreased stride length     General Gait Details: cues to roll RW, for stride length   Stairs             Wheelchair Mobility    Modified Rankin (Stroke Patients Only)       Balance                                            Cognition Arousal/Alertness: Awake/alert Behavior During Therapy: WFL for tasks assessed/performed Overall Cognitive Status: Within Functional Limits for tasks assessed                                        Exercises      General Comments        Pertinent Vitals/Pain Pain Assessment: 0-10 Pain Score: 3  Pain Location: right hip Pain Descriptors / Indicators: Sore Pain Intervention(s): Limited  activity within patient's tolerance;Monitored during session;Premedicated before session;Repositioned    Home Living                      Prior Function            PT Goals (current goals can now be found in the care plan section) Acute Rehab PT Goals Patient Stated Goal: home PT Goal Formulation: With patient Time For Goal Achievement: 12/16/19 Potential to Achieve Goals: Good Progress towards PT goals: Progressing toward goals    Frequency    7X/week      PT Plan Current plan remains appropriate    Co-evaluation              AM-PAC PT "6 Clicks" Mobility   Outcome Measure  Help needed turning from your back to your side while in a flat bed without using bedrails?: None Help needed moving from lying on your back to sitting on the side of a flat bed without using bedrails?: None Help needed moving to and from a bed to a chair (including a wheelchair)?: None Help needed standing up from a chair using your arms (e.g., wheelchair or bedside chair)?: None Help needed to walk in hospital room?: A Little Help needed climbing  3-5 steps with a railing? : A Little 6 Click Score: 22    End of Session Equipment Utilized During Treatment: Gait belt Activity Tolerance: Patient tolerated treatment well Patient left: in bed;with call bell/phone within reach;with nursing/sitter in room Nurse Communication: Mobility status PT Visit Diagnosis: Difficulty in walking, not elsewhere classified (R26.2)     Time: 5009-3818 PT Time Calculation (min) (ACUTE ONLY): 24 min  Charges:  $Gait Training: 23-37 mins                     Delice Bison, PT  Acute Rehab Dept ([facility]) [phone] Pager [phone]  12/11/2019    [facility] 12/11/2019, 11:05 AM

## 2020-07-01 IMAGING — RF DG HIP (WITH PELVIS) OPERATIVE*R*
1 series · 2 of 2 positions shown · non-contrast
Comparison: None.

CLINICAL DATA: Right anterior hip arthroplasty

EXAM:
OPERATIVE RIGHT HIP (WITH PELVIS IF PERFORMED) 2 VIEWS
TECHNIQUE: Fluoroscopic spot image(s) were submitted for interpretation
post-operatively.

[Series 1: unknown protocol · 0.20mm/px · 2 of 2 slices shown]
[im 1/2]
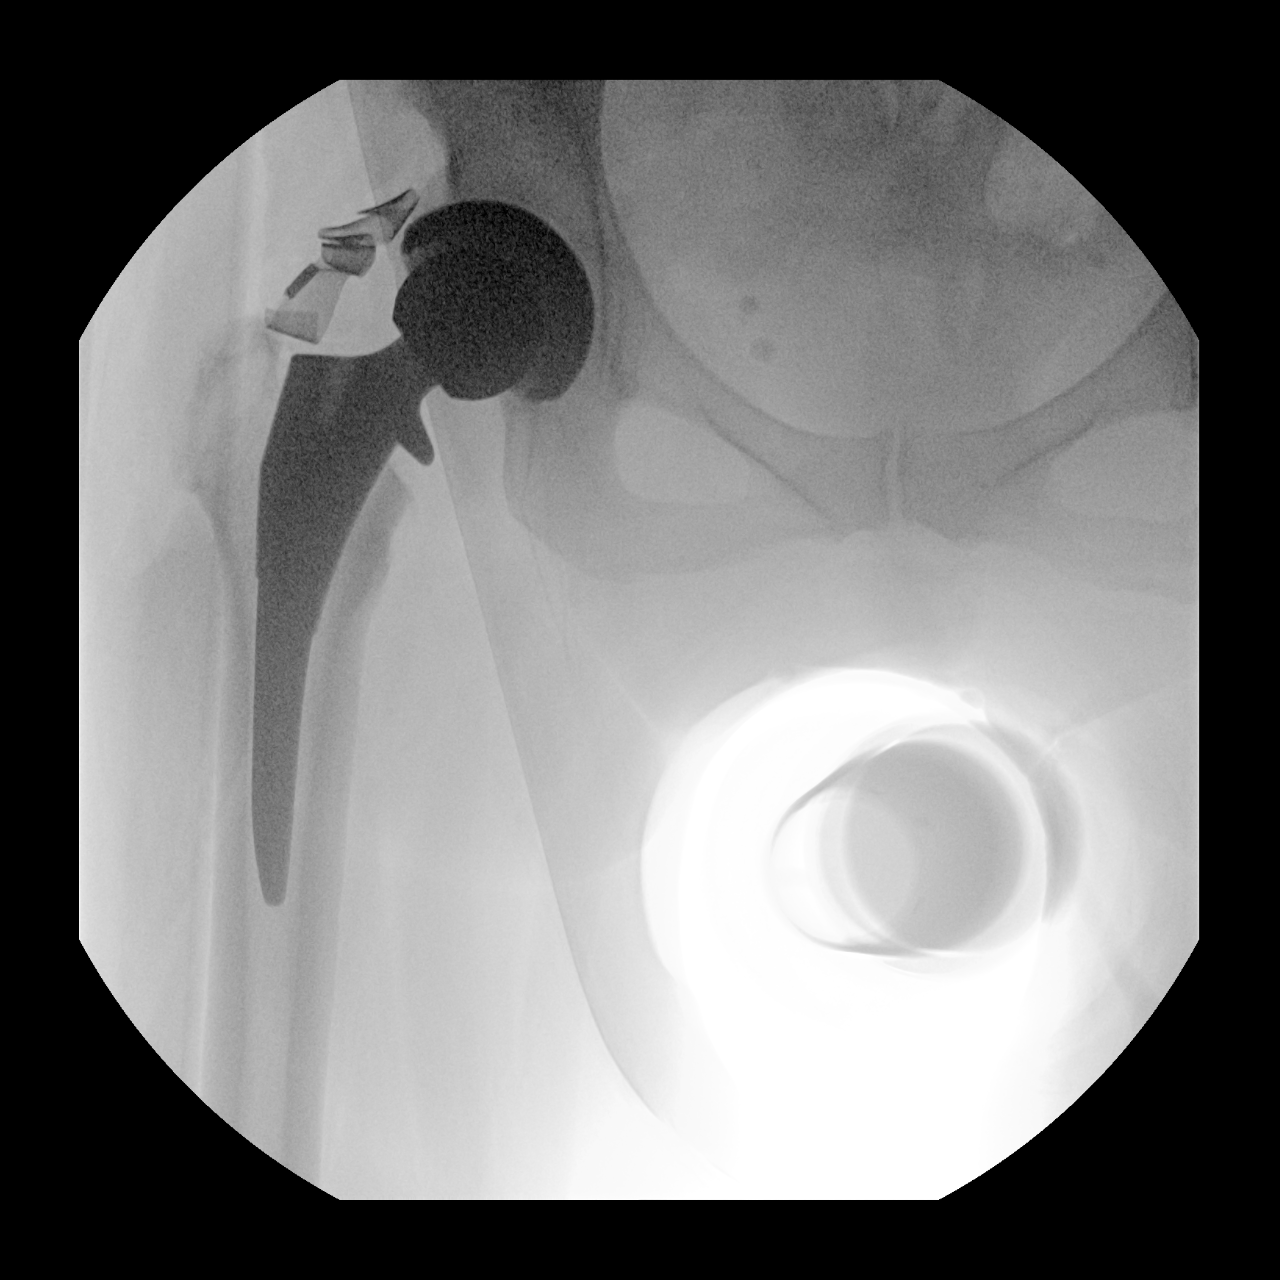
[im 2/2]
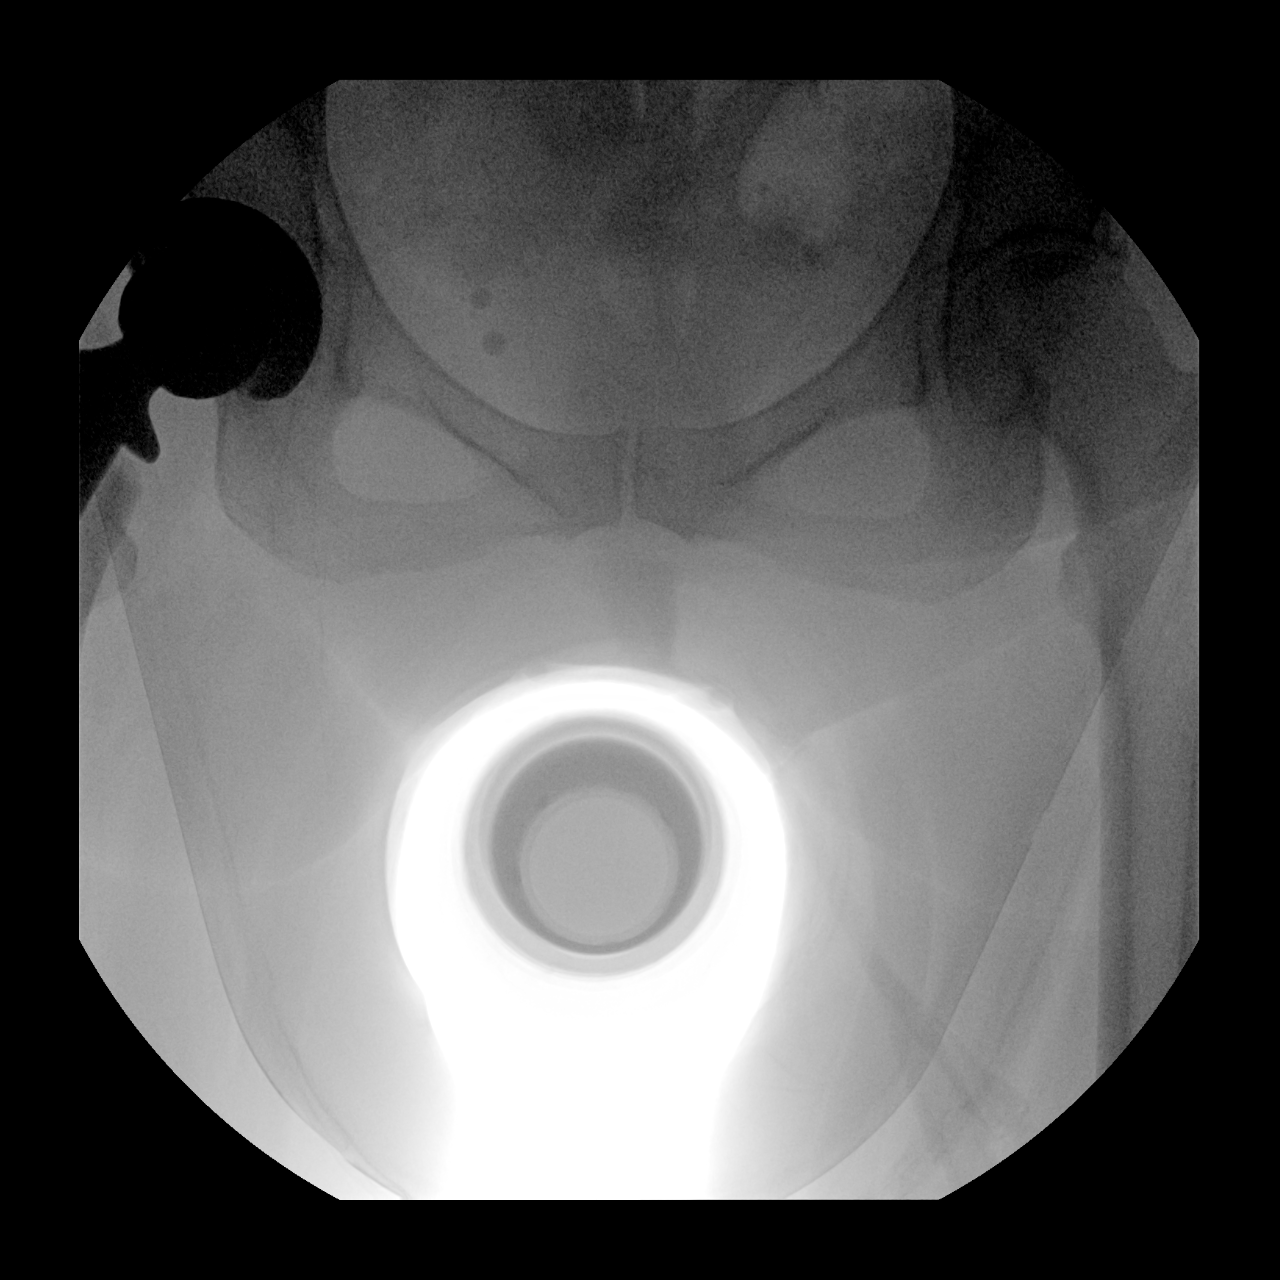

[2 of 2 positions shown; findings below may reference images not displayed]

FINDINGS: Fluoroscopy time 0 minutes 11 seconds. 2 spot fluoroscopic
nondiagnostic intraoperative right hip radiographs demonstrate
postsurgical changes from right total hip arthroplasty with no
evidence of right hip dislocation on these frontal views.
IMPRESSION: Intraoperative fluoroscopic guidance for right total hip
arthroplasty.
